# Patient Record
Sex: Male | Born: 1970 | Race: Black or African American | Hispanic: No | Marital: Single | Smoking: Former smoker
Health system: Southern US, Community
[De-identification: ages and names within clinical notes are randomized; demographics above are authoritative.]

## PROBLEM LIST (undated history)

## (undated) DIAGNOSIS — I10 Essential (primary) hypertension: Secondary | ICD-10-CM

## (undated) DIAGNOSIS — F319 Bipolar disorder, unspecified: Secondary | ICD-10-CM

## (undated) HISTORY — DX: Bipolar disorder, unspecified: F31.9

## (undated) HISTORY — DX: Essential (primary) hypertension: I10

---

## 2006-09-19 ENCOUNTER — Emergency Department (HOSPITAL_COMMUNITY): Admission: EM | Admit: 2006-09-19 | Discharge: 2006-09-19 | Payer: Self-pay | Admitting: Emergency Medicine

## 2006-12-21 ENCOUNTER — Ambulatory Visit: Payer: Self-pay | Admitting: Family Medicine

## 2007-05-18 ENCOUNTER — Ambulatory Visit: Payer: Self-pay | Admitting: Family Medicine

## 2007-08-23 ENCOUNTER — Ambulatory Visit: Payer: Self-pay | Admitting: Family Medicine

## 2007-10-26 ENCOUNTER — Ambulatory Visit: Payer: Self-pay | Admitting: Family Medicine

## 2008-06-14 ENCOUNTER — Ambulatory Visit: Payer: Self-pay | Admitting: Family Medicine

## 2008-12-27 ENCOUNTER — Encounter (INDEPENDENT_AMBULATORY_CARE_PROVIDER_SITE_OTHER): Payer: Self-pay | Admitting: *Deleted

## 2009-01-03 ENCOUNTER — Ambulatory Visit: Payer: Self-pay | Admitting: Family Medicine

## 2009-01-03 DIAGNOSIS — F319 Bipolar disorder, unspecified: Secondary | ICD-10-CM | POA: Insufficient documentation

## 2009-01-03 DIAGNOSIS — R21 Rash and other nonspecific skin eruption: Secondary | ICD-10-CM | POA: Insufficient documentation

## 2009-01-03 DIAGNOSIS — I10 Essential (primary) hypertension: Secondary | ICD-10-CM | POA: Insufficient documentation

## 2009-01-08 ENCOUNTER — Telehealth: Payer: Self-pay | Admitting: Family Medicine

## 2009-01-08 LAB — CONVERTED CEMR LAB
Albumin: 4.1 g/dL (ref 3.5–5.2)
Alkaline Phosphatase: 53 units/L (ref 39–117)
Basophils Absolute: 0.1 10*3/uL (ref 0.0–0.1)
Bilirubin, Direct: 0 mg/dL (ref 0.0–0.3)
CO2: 31 meq/L (ref 19–32)
Calcium: 9.5 mg/dL (ref 8.4–10.5)
Chlamydia, Swab/Urine, PCR: NEGATIVE
Cholesterol: 143 mg/dL (ref 0–200)
Creatinine, Ser: 1.2 mg/dL (ref 0.4–1.5)
Eosinophils Absolute: 0.2 10*3/uL (ref 0.0–0.7)
GC Probe Amp, Urine: NEGATIVE
Glucose, Bld: 93 mg/dL (ref 70–99)
HDL: 49 mg/dL (ref >39.00)
Lymphocytes Relative: 16.9 % (ref 12.0–46.0)
MCHC: 34.2 g/dL (ref 30.0–36.0)
Monocytes Absolute: 0.5 10*3/uL (ref 0.1–1.0)
Neutro Abs: 5.8 10*3/uL (ref 1.4–7.7)
Neutrophils Relative %: 74 % (ref 43.0–77.0)
RDW: 12.4 % (ref 11.5–14.6)
Triglycerides: 172 mg/dL — ABNORMAL HIGH (ref 0.0–149.0)

## 2009-01-22 ENCOUNTER — Ambulatory Visit: Payer: Self-pay | Admitting: Family Medicine

## 2009-01-23 ENCOUNTER — Encounter: Payer: Self-pay | Admitting: Family Medicine

## 2009-01-23 LAB — CONVERTED CEMR LAB
Albumin: 4 g/dL (ref 3.5–5.2)
Bilirubin, Direct: 0 mg/dL (ref 0.0–0.3)
GGT: 86 units/L — ABNORMAL HIGH (ref 7–51)
Total Protein: 6.9 g/dL (ref 6.0–8.3)

## 2009-02-04 ENCOUNTER — Ambulatory Visit: Payer: Self-pay | Admitting: Family Medicine

## 2009-02-04 DIAGNOSIS — F17200 Nicotine dependence, unspecified, uncomplicated: Secondary | ICD-10-CM | POA: Insufficient documentation

## 2009-02-18 ENCOUNTER — Ambulatory Visit: Payer: Self-pay | Admitting: Family Medicine

## 2009-04-03 ENCOUNTER — Telehealth: Payer: Self-pay | Admitting: Family Medicine

## 2009-05-08 ENCOUNTER — Telehealth: Payer: Self-pay | Admitting: Family Medicine

## 2009-07-16 ENCOUNTER — Ambulatory Visit: Payer: Self-pay | Admitting: Family Medicine

## 2009-11-08 ENCOUNTER — Ambulatory Visit: Payer: Self-pay | Admitting: Family Medicine

## 2009-11-08 DIAGNOSIS — B351 Tinea unguium: Secondary | ICD-10-CM | POA: Insufficient documentation

## 2009-11-14 ENCOUNTER — Telehealth (INDEPENDENT_AMBULATORY_CARE_PROVIDER_SITE_OTHER): Payer: Self-pay | Admitting: *Deleted

## 2009-11-20 ENCOUNTER — Ambulatory Visit: Payer: Self-pay | Admitting: Family Medicine

## 2009-11-20 DIAGNOSIS — Z8639 Personal history of other endocrine, nutritional and metabolic disease: Secondary | ICD-10-CM

## 2009-11-20 DIAGNOSIS — Z862 Personal history of diseases of the blood and blood-forming organs and certain disorders involving the immune mechanism: Secondary | ICD-10-CM | POA: Insufficient documentation

## 2009-11-26 LAB — CONVERTED CEMR LAB
AST: 37 units/L (ref 0–37)
Alkaline Phosphatase: 53 units/L (ref 39–117)
Bilirubin, Direct: 0.2 mg/dL (ref 0.0–0.3)
Calcium: 9.5 mg/dL (ref 8.4–10.5)
GFR calc non Af Amer: 89.87 mL/min (ref >60)
Glucose, Bld: 96 mg/dL (ref 70–99)
Potassium: 4.1 meq/L (ref 3.5–5.1)
Sodium: 142 meq/L (ref 135–145)
Total Bilirubin: 0.4 mg/dL (ref 0.3–1.2)

## 2009-12-05 ENCOUNTER — Telehealth: Payer: Self-pay | Admitting: Family Medicine

## 2009-12-11 ENCOUNTER — Telehealth (INDEPENDENT_AMBULATORY_CARE_PROVIDER_SITE_OTHER): Payer: Self-pay | Admitting: *Deleted

## 2009-12-23 ENCOUNTER — Telehealth (INDEPENDENT_AMBULATORY_CARE_PROVIDER_SITE_OTHER): Payer: Self-pay | Admitting: *Deleted

## 2010-01-16 ENCOUNTER — Ambulatory Visit: Payer: Self-pay | Admitting: Family Medicine

## 2010-01-16 ENCOUNTER — Encounter: Payer: Self-pay | Admitting: Family Medicine

## 2010-01-16 DIAGNOSIS — L259 Unspecified contact dermatitis, unspecified cause: Secondary | ICD-10-CM | POA: Insufficient documentation

## 2010-01-16 DIAGNOSIS — Z87891 Personal history of nicotine dependence: Secondary | ICD-10-CM | POA: Insufficient documentation

## 2010-01-17 LAB — CONVERTED CEMR LAB
Basophils Absolute: 0 10*3/uL (ref 0.0–0.1)
HCT: 40.1 % (ref 39.0–52.0)
Hemoglobin: 13.9 g/dL (ref 13.0–17.0)
Lymphs Abs: 1.7 10*3/uL (ref 0.7–4.0)
MCHC: 34.7 g/dL (ref 30.0–36.0)
MCV: 93.7 fL (ref 78.0–100.0)
Monocytes Absolute: 0.8 10*3/uL (ref 0.1–1.0)
Neutro Abs: 6.2 10*3/uL (ref 1.4–7.7)
Platelets: 210 10*3/uL (ref 150.0–400.0)
RDW: 13.8 % (ref 11.5–14.6)
TSH: 1.53 microintl units/mL (ref 0.35–5.50)

## 2010-02-04 ENCOUNTER — Telehealth (INDEPENDENT_AMBULATORY_CARE_PROVIDER_SITE_OTHER): Payer: Self-pay | Admitting: *Deleted

## 2010-02-10 ENCOUNTER — Ambulatory Visit: Payer: Self-pay | Admitting: Family Medicine

## 2010-02-10 DIAGNOSIS — L738 Other specified follicular disorders: Secondary | ICD-10-CM | POA: Insufficient documentation

## 2010-02-24 ENCOUNTER — Telehealth: Payer: Self-pay | Admitting: Family Medicine

## 2010-05-14 NOTE — Assessment & Plan Note (Signed)
Summary: discuss med to stop smoking/cbs   Vital Signs:  Patient profile:   40 year old male Height:      73 inches Weight:      200 pounds BMI:     26.48 Temp:     98.4 degrees F oral Pulse rate:   86 / minute BP sitting:   150 / 102  (left arm)  Vitals Entered By: Jeremy Johann CMA (November 08, 2009 2:55 PM) CC: discuss med to stop smoking , Depressive symptoms Comments pt not currently taking any med but will restart all med since he now has insurance....Marland KitchenMarland KitchenFelecia Deloach CMA  November 08, 2009 3:01 PM    History of Present Illness:  Hypertension follow-up      This is a 40 year old man who presents for Hypertension follow-up.  The patient denies lightheadedness, urinary frequency, headaches, edema, impotence, rash, and fatigue.  The patient denies the following associated symptoms: chest pain, chest pressure, exercise intolerance, dyspnea, palpitations, syncope, leg edema, and pedal edema.  Compliance with medications (by patient report) has been poor.  The patient reports that dietary compliance has been fair.  The patient reports no exercise.  Adjunctive measures currently used by the patient include salt restriction.    Pt is also interested in smoking---the nicotrol inhaler worked for him in the past.  He also c/o think yellow toenails.   Depressive symptoms      The patient also presents with Depressive symptoms.  The patient reports depressed mood, loss of interest/pleasure, and hypersomnia, but denies significant weight loss, significant weight gain, insomnia, psychomotor agitation, and psychomotor retardation.  The patient also reports fatigue or loss of energy and feelings of worthlessness.  The patient denies diminished concentration, indecisiveness, thoughts of death, thoughts of suicide, suicidal intent, and suicidal plans.  The patient reports the following psychosocial stressors: major life changes.  Patient's past history includes depression.  The patient denies abnormally  elevated mood, abnormally irritable mood, decreased need for sleep, increased talkativeness, distractibility, flight of ideas, increased goal-directed activity, and inflated self-esteem/ grandiosity.    Preventive Screening-Counseling & Management  Alcohol-Tobacco     Smoking Status: current     Smoking Cessation Counseling: yes     Smoke Cessation Stage: ready     Packs/Day: 1.5     Year Started: 1990  Caffeine-Diet-Exercise     Does Patient Exercise: no  Current Medications (verified): 1)  Calan Sr 240 Mg Cr-Tabs (Verapamil Hcl) .Marland Kitchen.. 1 By Mouth Once Daily. 2)  Aspirin 81 Mg Tbec (Aspirin) .Marland Kitchen.. 1 By Mouth Once Daily. 3)  Losartan Potassium 50 Mg Tabs (Losartan Potassium) .Marland Kitchen.. 1 By Mouth Once Daily 4)  Prozac 20 Mg Caps (Fluoxetine Hcl) .Marland Kitchen.. 1 By Mouth Daily. 5)  Zyprexa 5 Mg Tabs (Olanzapine) .Marland Kitchen.. 1 By Mouth Daily. 6)  Nicotrol 10 Mg Inha (Nicotine) .... As Directed --Up To 16 Cartridges A Day  Allergies (verified): No Known Drug Allergies  Past History:  Past medical, surgical, family and social histories (including risk factors) reviewed for relevance to current acute and chronic problems.  Past Medical History: Reviewed history from 01/03/2009 and no changes required. Hypertension Current Problems:  BIPOLAR DISORDER UNSPECIFIED (ICD-296.80) SKIN RASH (ICD-782.1) PREVENTIVE HEALTH CARE (ICD-V70.0) FAMILY HISTORY DIABETES 1ST DEGREE RELATIVE (ICD-V18.0) HYPERTENSION (ICD-401.9)  Family History: Reviewed history from 01/03/2009 and no changes required. Family History Diabetes 1st degree relative Family History Hypertension Family History of Prostate CA 1st degree relative <50  Social History: Reviewed history from 02/18/2009 and  no changes required. unemployed Alcohol use-yes Regular exercise-no Former Smoker Smoking Status:  current Packs/Day:  1.5  Review of Systems      See HPI  Physical Exam  General:  Well-developed,well-nourished,in no acute distress;  alert,appropriate and cooperative throughout examination Neck:  No deformities, masses, or tenderness noted. Lungs:  Normal respiratory effort, chest expands symmetrically. Lungs are clear to auscultation, no crackles or wheezes. Heart:  normal rate and no murmur.   Extremities:  No clubbing, cyanosis, edema, or deformity noted with normal full range of motion of all joints.  + big toenails --thick, yellow, cracked Skin:  Intact without suspicious lesions or rashes Cervical Nodes:  No lymphadenopathy noted Psych:  Oriented X3, normally interactive, good eye contact, not suicidal, and subdued.     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   Problem # 3:  TOBACCO ABUSE (ICD-305.1)  Encouraged smoking cessation and discussed different methods for smoking cessation.   His updated medication list for this problem includes:    Nicotrol 10 Mg Inha (Nicotine) .Marland Kitchen... As directed --up to 16 cartridges a day  Problem # 4:  HYPERTENSION (ICD-401.9)  His updated medication list for this problem includes:    Calan Sr 240 Mg Cr-tabs (Verapamil hcl) .Marland Kitchen... 1 by mouth once daily.    Losartan Potassium 50 Mg Tabs (Losartan potassium) .Marland Kitchen... 1 by mouth once daily  BP today: 150/102 Prior BP: 136/86 (02/18/2009)  Labs Reviewed: K+: 3.7 (01/03/2009) Creat: : 1.2 (01/03/2009)   Chol: 143 (01/03/2009)   HDL: 49.00 (01/03/2009)   LDL: 60 (01/03/2009)   TG: 172.0 (01/03/2009)  Complete Medication List: 1)  Calan Sr 240 Mg Cr-tabs (Verapamil hcl) .Marland Kitchen.. 1 by mouth once daily. 2)  Aspirin 81 Mg Tbec (Aspirin) .Marland Kitchen.. 1 by mouth once daily. 3)  Losartan Potassium 50 Mg Tabs (Losartan potassium) .Marland Kitchen.. 1 by mouth once daily 4)  Prozac 20 Mg Caps (Fluoxetine hcl) .Marland Kitchen.. 1 by mouth  daily. 5)  Zyprexa 5 Mg Tabs (Olanzapine) .Marland Kitchen.. 1 by mouth daily. 6)  Nicotrol 10 Mg Inha (Nicotine) .... As directed --up to 16 cartridges a day  Patient Instructions: 1)  Please schedule a follow-up appointment in 2 weeks.  Prescriptions: NICOTROL 10 MG INHA (NICOTINE) as directed --up to 16 cartridges a day  #1 month x 2   Entered and Authorized by:   Loreen Freud DO   Signed by:   Loreen Freud DO on 11/08/2009   Method used:   Electronically to        [facility] #[id number]* (retail)       [address] [place], [place]  [address]       Ph: [phone]       Fax: [phone]   RxID:   [id number] ZYPREXA 5 MG TABS (OLANZAPINE) 1 by mouth daily.  #30 x 0   Entered and Authorized by:   Loreen Freud DO   Signed by:   Loreen Freud DO on 11/08/2009   Method used:   Electronically to        [facility] #[id number]* (retail)       [address] [place], [place]  [address]       Ph: [phone]  Fax: [phone]   RxID:   [id number] PROZAC 20 MG CAPS (FLUOXETINE HCL) 1 by mouth daily.  #30 x 0   Entered and Authorized by:   Loreen Freud DO   Signed by:   Loreen Freud DO on 11/08/2009   Method used:   Electronically to        [facility] #[id number]* (retail)       [address] [place], [place]  [address]       Ph: [phone]       Fax: [phone]   RxID:   [id number] LOSARTAN POTASSIUM 50 MG TABS (LOSARTAN POTASSIUM) 1 by mouth once daily  #30 x 0   Entered and Authorized by:   Loreen Freud DO   Signed by:   Loreen Freud DO on 11/08/2009   Method used:   Electronically to        [facility] #[id number]* (retail)       [address] [place], [place]  [address]       Ph: [phone]       Fax: [phone]   RxID:   [id number] CALAN SR 240 MG CR-TABS (VERAPAMIL HCL) 1 by mouth once daily.  #30 x 0   Entered and Authorized by:   Loreen Freud DO   Signed by:   Loreen Freud DO on 11/08/2009   Method  used:   Electronically to        [facility] #[id number]* (retail)       [address] [place], [place]  [address]       Ph: [phone]       Fax: [phone]   RxID:   [id number]

## 2010-05-14 NOTE — Assessment & Plan Note (Signed)
Summary: CPX/FASTING//KN   Vital Signs:  Patient profile:   40 year old male Height:      74 inches Weight:      202.2 pounds Temp:     97.2 degrees F oral Pulse rate:   74 / minute Pulse rhythm:   regular BP sitting:   172 / 110  (left arm) Cuff size:   large  Vitals Entered By: Almeta Monas CMA Duncan Dull) (January 16, 2010 8:31 AM) CC: CPX/FASTING   Serial Vital Signs/Assessments:  Time      Position  BP       Pulse  Resp  Temp     By 8:32 AM             150/100                        Almeta Monas CMA (AAMA)   History of Present Illness: Pt here for cpe and labs --- Pt thinks he has carpal tunnel R wrist --pt types on computer all day.   Pt also c/o dry skin on L arm ---used cortisone cream with little relief.     Hypertension follow-up      This is a 40 year old man who presents for Hypertension follow-up.  The patient denies lightheadedness, urinary frequency, headaches, edema, impotence, rash, and fatigue.  The patient denies the following associated symptoms: chest pain, chest pressure, exercise intolerance, dyspnea, palpitations, syncope, leg edema, and pedal edema.  Compliance with medications (by patient report) has been poor.  The patient reports that dietary compliance has been good.  The patient reports exercising occasionally.  Adjunctive measures currently used by the patient include salt restriction.    Preventive Screening-Counseling & Management  Alcohol-Tobacco     Alcohol drinks/day: >5     Alcohol type: beer     >5/day in last 3 mos: yes     Alcohol Counseling: to STOP drinking     Feels need to cut down: yes     Feels guilty re: drinking: yes     Needs 'eye opener' in am: yes     Smoking Status: quit     Smoking Cessation Counseling: yes     Smoke Cessation Stage: quit     Packs/Day: 1.5     Year Started: 1990     Year Quit: 2011  Caffeine-Diet-Exercise     Caffeine use/day: 0     Does Patient Exercise: yes     Type of exercise: walk  Times/week: <3     Exercise Counseling: to improve exercise regimen  Hep-HIV-STD-Contraception     HIV Risk: risk noted     HIV Risk Counseling: to avoid increased HIV risk     STD Risk: risk noted     STD Risk Counseling: to avoid increased STD risk     Dental Visit-last 6 months yes     Dental Care Counseling: to seek dental care; no dental care within six months  Safety-Violence-Falls     Seat Belt Use: yes      Sexual History:  separated.    Current Medications (verified): 1)  Calan Sr 240 Mg Cr-Tabs (Verapamil Hcl) .Marland Kitchen.. 1 By Mouth Once Daily. 2)  Aspirin 81 Mg Tbec (Aspirin) .Marland Kitchen.. 1 By Mouth Once Daily. 3)  Losartan Potassium 100 Mg Tabs (Losartan Potassium) .Marland Kitchen.. 1 By Mouth Once Daily 4)  Prozac 20 Mg Caps (Fluoxetine Hcl) .Marland Kitchen.. 1 By Mouth Daily. 5)  Zyprexa 5 Mg  Tabs (Olanzapine) .Marland Kitchen.. 1 By Mouth Daily. 6)  Nicotrol 10 Mg Inha (Nicotine) .... As Directed-Up To 16 Cartridges A Day  Allergies (verified): No Known Drug Allergies  Past History:  Past Medical History: Last updated: 01/03/2009 Hypertension Current Problems:  BIPOLAR DISORDER UNSPECIFIED (ICD-296.80) SKIN RASH (ICD-782.1) PREVENTIVE HEALTH CARE (ICD-V70.0) FAMILY HISTORY DIABETES 1ST DEGREE RELATIVE (ICD-V18.0) HYPERTENSION (ICD-401.9)  Family History: Last updated: 01/03/2009 Family History Diabetes 1st degree relative Family History Hypertension Family History of Prostate CA 1st degree relative <50  Social History: Last updated: 01/16/2010 Alcohol use-yes Regular exercise-no Former Smoker 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 Family History Diabetes 1st degree relative Family History Hypertension Family History of Prostate CA 1st degree relative <50  Social History: Reviewed history from 11/20/2009 and no changes required. 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 Eyes:  Denies blurring, discharge, double vision, eye irritation, eye pain, halos, itching, light sensitivity, red eye, vision loss-1 eye, and vision loss-both eyes; optho-q1y. ENT:  Denies decreased hearing, difficulty swallowing, ear discharge, earache, hoarseness, nasal congestion, nosebleeds, postnasal drainage, ringing in ears, sinus pressure, and sore throat. CV:  Denies bluish discoloration of lips or nails, chest pain or discomfort, difficulty breathing at night, difficulty breathing while lying down, fainting, fatigue, leg cramps with exertion, lightheadness, near fainting, palpitations, shortness of breath with exertion, swelling of feet, swelling of hands, and weight gain. Resp:  Denies chest discomfort, chest pain with inspiration, cough, coughing up blood, excessive snoring, hypersomnolence, morning headaches, pleuritic, shortness of breath, sputum productive, and wheezing. GI:  Denies abdominal pain, bloody stools, change in bowel habits, constipation, dark tarry stools, diarrhea, excessive appetite, gas, hemorrhoids, indigestion, loss of appetite, nausea, vomiting, vomiting blood, and yellowish skin color. GU:  Denies decreased libido, discharge, dysuria, erectile dysfunction, genital sores, hematuria, incontinence, nocturia, urinary frequency, and urinary hesitancy. MS:   Denies joint pain, joint redness, joint swelling, loss of strength, low back pain, mid back pain, muscle aches, muscle , cramps, muscle weakness, stiffness, and thoracic pain. Derm:  Denies changes in color of skin, changes in nail beds, dryness, excessive perspiration, flushing, hair loss, insect bite(s), itching, lesion(s), poor wound healing, and rash. Neuro:  Denies brief paralysis, difficulty with concentration, disturbances in coordination, falling down, headaches, inability to speak, memory loss, numbness, poor balance, seizures, sensation of room spinning, tingling, tremors, visual disturbances, and weakness. Psych:  Denies alternate hallucination ( auditory/visual), anxiety, depression, easily angered, easily tearful, irritability, mental problems, panic attacks, sense of great danger, suicidal thoughts/plans, thoughts of violence, unusual visions or sounds, and thoughts /plans of harming others. Endo:  Denies cold intolerance, excessive hunger, excessive thirst, excessive urination, heat intolerance, polyuria, and weight change. Heme:  Denies abnormal bruising, bleeding, enlarge lymph nodes, fevers, pallor, and skin discoloration. Allergy:  Denies hives or rash, itching eyes, persistent infections, seasonal allergies, and sneezing.  Physical Exam  General:  Well-developed,well-nourished,in no acute distress; alert,appropriate and cooperative throughout examination Head:  Normocephalic and atraumatic without obvious abnormalities. No apparent alopecia or balding. Eyes:  vision grossly intact, pupils equal, pupils round, pupils reactive to light, and no injection.   Ears:  External ear exam shows no significant lesions or deformities.  Otoscopic examination reveals clear canals, tympanic membranes are intact bilaterally without bulging, retraction, inflammation or discharge. Hearing is grossly normal bilaterally. Nose:  External nasal examination shows no deformity or inflammation. Nasal mucosa  are pink and moist without lesions or exudates. Mouth:  Oral mucosa and oropharynx without lesions or exudates.  Teeth in good repair. Neck:  No deformities, masses, or tenderness noted.no carotid bruits.   Chest Wall:  No deformities, masses, tenderness or gynecomastia noted. Lungs:  Normal respiratory effort, chest expands symmetrically. Lungs are clear to auscultation, no crackles or wheezes. Heart:  normal rate and no murmur.   Abdomen:  Bowel sounds positive,abdomen soft and non-tender without masses, organomegaly or hernias noted. Rectal:  No external abnormalities noted. Normal sphincter tone. No rectal masses or tenderness. heme neg brown stool Genitalia:  Testes bilaterally descended without nodularity, tenderness or masses. No scrotal masses or lesions. No penis lesions or urethral discharge. Prostate:  Prostate gland firm and smooth, no enlargement, nodularity, tenderness, mass, asymmetry or induration. Msk:  normal ROM, no joint tenderness, no joint swelling, no joint warmth, no redness over joints, no joint deformities, no joint instability, and no crepitation.   Pulses:  R and L carotid,radial,femoral,dorsalis pedis and posterior tibial pulses are full and equal bilaterally Extremities:  No clubbing, cyanosis, edema, or deformity noted with normal full range of motion of all joints.   Neurologic:  No cranial nerve deficits noted. Station and gait are normal. Plantar reflexes are down-going bilaterally. DTRs are symmetrical throughout. Sensory, motor and coordinative functions appear intact. Skin:  dry patches on arms and legs no errythema Cervical Nodes:  No lymphadenopathy noted Psych:  Cognition and judgment appear intact. Alert and cooperative with normal attention span and concentration. No apparent delusions, illusions, hallucinations   Impression & Recommendations:  Problem # 1:  PREVENTIVE HEALTH CARE (ICD-V70.0)  Orders: Venipuncture (11914) TLB-CBC Platelet -  w/Differential (85025-CBCD) TLB-TSH (Thyroid Stimulating Hormone) (84443-TSH) TLB-PSA (Prostate Specific Antigen) (84153-PSA) T- * Misc. Laboratory test [phone]) Specimen Handling (62130) EKG w/ Interpretation (93000)  Reviewed preventive care protocols, scheduled due services, and updated immunizations.  Problem # 2:  TOBACCO USE, QUIT (ICD-V15.82)  still using nicotrol inhaler Encouraged smoking cessation and discussed different methods for smoking cessation.   Orders: EKG w/ Interpretation (93000)  Problem # 3:  BIPOLAR DISORDER UNSPECIFIED (ICD-296.80)  per psych  Orders: EKG w/ Interpretation (93000) Psychiatric Referral (Psych)  Problem # 4:  HYPERTENSION (ICD-401.9)  Orders: Venipuncture (86578) TLB-CBC Platelet - w/Differential (85025-CBCD) TLB-TSH (Thyroid Stimulating Hormone) (84443-TSH) TLB-PSA (Prostate Specific Antigen) (84153-PSA) T- * Misc. Laboratory test [phone]) Specimen Handling (95284) EKG w/ Interpretation (93000)  His updated medication list for this problem includes:    Calan Sr 240 Mg Cr-tabs (Verapamil hcl) .Marland Kitchen... 1 by mouth once daily.    Losartan Potassium 100 Mg Tabs (Losartan potassium) .Marland Kitchen... 1 by mouth once daily  BP today: 172/110 Prior BP: 130/96 (11/20/2009)  Labs Reviewed: K+: 4.1 (11/20/2009) Creat: : 1.2 (11/20/2009)   Chol: 143 (01/03/2009)   HDL: 49.00 (01/03/2009)   LDL: 60 (01/03/2009)   TG: 172.0 (01/03/2009)  Problem # 5:  ECZEMA (ICD-692.9)  use otc cortisone  mixed with eucerin cream  Discussed avoidance of triggers and symptomatic treatment.   Complete Medication List: 1)  Calan Sr 240 Mg Cr-tabs (Verapamil hcl) .Marland Kitchen.. 1 by mouth once daily. 2)  Aspirin 81 Mg Tbec (Aspirin) .Marland Kitchen.. 1 by mouth once daily. 3)  Losartan Potassium 100 Mg Tabs (Losartan potassium) .Marland Kitchen.. 1 by mouth once daily 4)  Prozac 20 Mg Caps (Fluoxetine hcl) .Marland Kitchen.. 1 by mouth daily. 5)  Zyprexa 5 Mg Tabs (Olanzapine) .Marland Kitchen.. 1 by mouth daily. 6)  Nicotrol 10 Mg  Inha (Nicotine) .... As directed-up to 16 cartridges a day  Other Orders: Tdap => 65yrs IM ([address]109) Admin 1st Vaccine ([address]) Admin 1st Vaccine (09811) Flu Vaccine 67yrs + (91478)  Patient Instructions: 1)  Please schedule a follow-up appointment in 2 weeks.  Prescriptions: NICOTROL 10 MG INHA (NICOTINE) as directed-up to 16 cartridges a day  #1 x 1   Entered and Authorized by:   Loreen Freud DO   Signed by:   Loreen Freud DO on 01/16/2010   Method used:   Electronically to        [facility] #[id number]* (retail)       [address] [place], [place]  [address]       Ph: [phone]       Fax: [phone]   RxID:   [id number] PROZAC 20 MG CAPS (FLUOXETINE HCL) 1 by mouth daily.  #90 x 3   Entered and Authorized by:   Loreen Freud DO   Signed by:   Loreen Freud DO on 01/16/2010   Method used:   Electronically to        [facility] #[id number]* (retail)       [address] [place], [place]  [address]       Ph: [phone]       Fax: [phone]   RxID:   [id number] HCWCBJSE POTASSIUM 100 MG TABS (LOSARTAN POTASSIUM) 1 by mouth once daily  #90 x 3   Entered and Authorized by:   Loreen Freud DO   Signed by:   Loreen Freud DO on 01/16/2010   Method used:   Electronically to        [facility] #[id number]* (retail)       [address] [place], [place]  [address]       Ph: [phone]       Fax: [phone]   RxID:   [id number] CALAN SR 240 MG CR-TABS (VERAPAMIL HCL) 1 by mouth once daily.  #90 x 3   Entered and Authorized by:   Loreen Freud DO   Signed by:   Loreen Freud DO on 01/16/2010   Method used:   Electronically to        [facility] #[id number]* (retail)       [address] [place], [place]  [address]       Ph: [phone]       Fax: [phone]   RxID:   [id number]    Immunizations Administered:  Tetanus Vaccine:    Vaccine Type: Tdap    Site: right deltoid    Mfr: Merck    Dose: 0.5 ml     Route: IM    Given by: Almeta Monas CMA (AAMA)    Exp. Date: 01/31/2012    Lot #: [id number]    VIS given: 02/29/08 version given  January 16, 2010.    Immunizations Administered:  Tetanus Vaccine:    Vaccine Type: Tdap    Site: right deltoid    Mfr: Merck    Dose: 0.5 ml    Route: IM    Given by: Almeta Monas CMA (AAMA)    Exp. Date: 01/31/2012    Lot #: [id number]    VIS given: 02/29/08 version given January 16, 2010. Flu Vaccine Consent Questions     Do you have a history of severe allergic reactions to this vaccine? no    Any prior history of allergic reactions to egg and/or gelatin? no    Do you have a sensitivity to the preservative Thimersol? no    Do you have a past history of Guillan-Barre Syndrome? no    Do you currently have an acute febrile illness? no    Have you ever had a severe reaction to latex? no    Vaccine information given and explained to patient? yes    Are you currently pregnant? no    Lot Number:[id number]   Exp Date:10/11/2010   Site Given  Left Deltoid IM.lbflu

## 2010-05-14 NOTE — Assessment & Plan Note (Signed)
Summary: rto 2 weeks/cbs   Vital Signs:  Patient profile:   40 year old male Height:      73 inches Weight:      204 pounds Temp:     98.4 degrees F oral Pulse rate:   72 / minute BP sitting:   130 / 96  (left arm)  Vitals Entered By: Jeremy Johann CMA (November 20, 2009 8:17 AM) CC: 2 week f/u bp check   History of Present Illness:  Hypertension follow-up      This is a 40 year old man who presents for Hypertension follow-up.  The patient denies lightheadedness, urinary frequency, headaches, edema, impotence, rash, and fatigue.  The patient denies the following associated symptoms: chest pain, chest pressure, exercise intolerance, dyspnea, palpitations, syncope, leg edema, and pedal edema.  Compliance with medications (by patient report) has been near 100%.  The patient reports that dietary compliance has been good.  The patient reports no exercise.  Adjunctive measures currently used by the patient include salt restriction.    Current Medications (verified): 1)  Calan Sr 240 Mg Cr-Tabs (Verapamil Hcl) .Marland Kitchen.. 1 By Mouth Once Daily. 2)  Aspirin 81 Mg Tbec (Aspirin) .Marland Kitchen.. 1 By Mouth Once Daily. 3)  Losartan Potassium 100 Mg Tabs (Losartan Potassium) .Marland Kitchen.. 1 By Mouth Once Daily 4)  Prozac 20 Mg Caps (Fluoxetine Hcl) .Marland Kitchen.. 1 By Mouth Daily. 5)  Zyprexa 5 Mg Tabs (Olanzapine) .Marland Kitchen.. 1 By Mouth Daily. 6)  Nicotrol 10 Mg Inha (Nicotine) .... As Directed --Up To 16 Cartridges A Day  Allergies (verified): No Known Drug Allergies  Past History:  Past medical, surgical, family and social histories (including risk factors) reviewed for relevance to current acute and chronic problems.  Past Medical History: Reviewed history from 01/03/2009 and no changes required. Hypertension Current Problems:  BIPOLAR DISORDER UNSPECIFIED (ICD-296.80) SKIN RASH (ICD-782.1) PREVENTIVE HEALTH CARE (ICD-V70.0) FAMILY HISTORY DIABETES 1ST DEGREE RELATIVE (ICD-V18.0) HYPERTENSION (ICD-401.9)  Family  History: Reviewed history from 01/03/2009 and no changes required. Family History Diabetes 1st degree relative Family History Hypertension Family History of Prostate CA 1st degree relative <50  Social History: Reviewed history from 02/18/2009 and no changes required. 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 Lungs:  Normal respiratory effort, chest expands symmetrically. Lungs are clear to auscultation, no crackles or wheezes. Heart:  normal rate and no murmur.   Extremities:  No clubbing, cyanosis, edema, or deformity noted with normal full range of motion of all joints.   Skin:  Intact without suspicious lesions or rashes Psych:  Cognition and judgment appear intact. Alert and cooperative with normal attention span and concentration. No apparent delusions, illusions, hallucinations   Impression & Recommendations:  Problem # 1:  HYPERTENSION (ICD-401.9)  His updated medication list for this problem includes:    Calan Sr 240 Mg Cr-tabs (Verapamil hcl) .Marland Kitchen... 1 by mouth once daily.    Losartan Potassium 100 Mg Tabs (Losartan potassium) .Marland Kitchen... 1 by mouth once daily  Orders: Venipuncture (56213) TLB-BMP (Basic Metabolic Panel-BMET) (80048-METABOL) TLB-Hepatic/Liver Function Pnl (80076-HEPATIC)  BP today: 130/96 Prior BP: 150/102 (11/08/2009)  Labs Reviewed: K+: 3.7 (01/03/2009) Creat: : 1.2 (01/03/2009)   Chol: 143 (01/03/2009)   HDL: 49.00 (01/03/2009)   LDL: 60 (01/03/2009)   TG: 172.0 (01/03/2009)  Problem # 2:  LIVER FUNCTION TESTS, ABNORMAL, HX OF (ICD-V12.2)  Orders: TLB-Hepatic/Liver Function Pnl (80076-HEPATIC)  Problem # 3:  BIPOLAR DISORDER UNSPECIFIED (ICD-296.80) con't meds pt is doing  better  Complete Medication List: 1)  Calan Sr 240 Mg Cr-tabs (Verapamil hcl) .Marland Kitchen.. 1 by mouth once daily. 2)  Aspirin 81 Mg Tbec (Aspirin) .Marland Kitchen.. 1 by mouth once daily. 3)  Losartan Potassium 100 Mg Tabs (Losartan potassium) .Marland Kitchen.. 1 by mouth once daily 4)  Prozac 20 Mg Caps (Fluoxetine hcl) .Marland Kitchen.. 1 by mouth daily. 5)  Zyprexa 5 Mg Tabs (Olanzapine) .Marland Kitchen.. 1 by mouth daily. 6)  Nicotrol 10 Mg Inha (Nicotine) .... As directed --up to 16 cartridges a day  Patient Instructions: 1)  schedule physical  Prescriptions: LOSARTAN POTASSIUM 100 MG TABS (LOSARTAN POTASSIUM) 1 by mouth once daily  #30 x 2   Entered and Authorized by:   Loreen Freud DO   Signed by:   Loreen Freud DO on 11/20/2009   Method used:   Electronically to        [id number]* (retail)       [address] [place], [place]  [address]       Ph: [phone]       Fax: [phone]   RxID:   [id number]   Appended Document: rto 2 weeks/cbs

## 2010-05-14 NOTE — Progress Notes (Signed)
Summary: Lamisil RF 10/26  Phone Note Call from Patient   Caller: Patient Call For: Devon Freud DO Details for Reason: Lamisil  Summary of Call: pt called and request a refill request of Lamisil be called to [facility] on [address]. Removed from med list... Pt is currently having a fungus on his foot.. C/b # [id number]. Please advise. Initial call taken by: Almeta Monas CMA Duncan Dull),  February 04, 2010 4:30 PM  Follow-up for Phone Call        ok to refill for 1 month 1 refill --  check LFTs in 6 weeks after starting----should take for 3 months total--we will need to check lft end of 12 weeks as well Follow-up by: Devon Freud DO,  February 04, 2010 5:24 PM  Additional Follow-up for Phone Call Additional follow up Details #1::        Rx faxed tp pharmacy... Mssg left to advise pt of the above. Additional Follow-up by: Almeta Monas CMA Duncan Dull),  February 05, 2010 8:08 AM    Additional Follow-up for Phone Call Additional follow up Details #2::    pt aware, will f/u in 12 wks for LFT check Follow-up by: Almeta Monas CMA Duncan Dull),  February 05, 2010 9:19 AM  New/Updated Medications: LAMISIL 250 MG TABS (TERBINAFINE HCL) 1 by mouth once daily Prescriptions: LAMISIL 250 MG TABS (TERBINAFINE HCL) 1 by mouth once daily  #30 x 1   Entered by:   Almeta Monas CMA (AAMA)   Authorized by:   Devon Freud DO   Signed by:   Almeta Monas CMA (AAMA) on 02/05/2010   Method used:   Faxed to ...       [facility] [address] (retail)       [phone]       [place], [place]  [address]       Ph: [phone]       Fax: [phone]   RxID:   [id number]

## 2010-05-14 NOTE — Progress Notes (Signed)
Summary: Request for nicotrol 9/13  Phone Note Call from Patient   Details for Reason: Request for Nicotrol Summary of Call: Mssg from pt requesting Nicotrol.  Last OV 11/20/09  No rx on file .c/b [id number] please advise. Initial call taken by: Almeta Monas CMA Duncan Dull),  December 23, 2009 3:29 PM  Follow-up for Phone Call        for what?  patch,  inhaler?  patches and lozenges are otc.   Follow-up by: Loreen Freud DO,  December 23, 2009 4:58 PM  Additional Follow-up for Phone Call Additional follow up Details #1::        Left message to call back  Almeta Monas CMA Duncan Dull)  December 24, 2009 8:26 AM     Additional Follow-up for Phone Call Additional follow up Details #2::    call from pt and he wanted to get a n rx of the Nicotrol inhaler that was prescribed on 11/08/09 to help him quit smoking, wants it called to [facility] on [address]. Per pt he did not have any refills and it is helping him C/B # [id number].  Almeta Monas CMA Duncan Dull)  December 24, 2009 5:11 PM    Additional Follow-up for Phone Call Additional follow up Details #3:: Details for Additional Follow-up Action Taken: nicotrol inh   As directed --up to 16 cartridges a day Additional Follow-up by: Loreen Freud DO,  December 25, 2009 9:16 AM  New/Updated Medications: NICOTROL 10 MG INHA (NICOTINE) as directed-up to 16 cartridges a day Prescriptions: NICOTROL 10 MG INHA (NICOTINE) as directed-up to 16 cartridges a day  #1 x 0   Entered by:   Almeta Monas CMA (AAMA)   Authorized by:   Loreen Freud DO   Signed by:   Almeta Monas CMA (AAMA) on 12/26/2009   Method used:   Electronically to        [facility] [address] [id number]* (retail)       [address] [place], [place]  [address]       Ph: [phone]       Fax: [phone]   RxID:   [id number]

## 2010-05-14 NOTE — Letter (Signed)
Summary: Cancer Screening/[facility] Personalized Risk Profile  Cancer Screening/[facility] Personalized Risk Profile   Imported By: Lanelle Bal 01/23/2010 08:33:40  _____________________________________________________________________  External Attachment:    Type:   Image     Comment:   External Document

## 2010-05-14 NOTE — Progress Notes (Signed)
Summary: lmtc 8-31-Hangnail  Phone Note Call from Patient Call back at Home Phone [phone]   Caller: Patient Summary of Call: pt went and had a manicure last week. he states he has an infected hangnail. wants to know if there is something that can do without an OV. please advise [phone] Initial call taken by: Lavell Islam,  December 11, 2009 12:56 PM  Follow-up for Phone Call        left message to call office.   per dr hopper soak nail in normal saline tid and montior for fever, or red streak and ov if no relief and update tetanus .........Marland KitchenFelecia Deloach CMA  December 11, 2009 2:51 PM     Additional Follow-up for Phone Call Additional follow up Details #1::        DISCUSS WITH PATIENT, pt verbalized understanding............Marland KitchenFelecia Deloach CMA  December 11, 2009 5:01 PM

## 2010-05-14 NOTE — Progress Notes (Signed)
Summary: RX request for Lamisil  lmtc 8/25  Phone Note Call from Patient Message from:  Patient on December 05, 2009 1:57 PM  Caller: Patient Call For: Loreen Freud DO Details for Reason: Pt wants an Rx for Lamisil Summary of Call: Pt called and stated that he was advised he could have an Rx for Lamisil if he came in and Liver function test wa normal. Per pt labs were normal and he wanted to get an RX for Lamisil called to [facility] on [address]. Please Advise.                 Almeta Monas CMA Duncan Dull)  December 05, 2009 1:59 PM   Follow-up for Phone Call        sent to pharmacy---  need to check liver function in 6 weeks--- before he gets 3rd refill----should be on it total 12 weeks   TC to pt. Lmtc on vm     Almeta Monas CMA Duncan Dull)  December 05, 2009 3:14 PM  Follow-up by: Loreen Freud DO,  December 05, 2009 2:09 PM  Additional Follow-up for Phone Call Additional follow up Details #1::        Pt made aware  and agree to make a f/u appt in 6 weeks.   Almeta Monas CMA Duncan Dull)  December 05, 2009 3:56 PM     New/Updated Medications: LAMISIL 250 MG TABS (TERBINAFINE HCL) 1 by mouth once daily Prescriptions: LAMISIL 250 MG TABS (TERBINAFINE HCL) 1 by mouth once daily  #30 x 1   Entered and Authorized by:   Loreen Freud DO   Signed by:   Loreen Freud DO on 12/05/2009   Method used:   Electronically to        [facility] [address] (retail)       [phone]       [place], [place]  [address]       Ph: [phone]       Fax: [phone]   RxID:   [id number]

## 2010-05-14 NOTE — Progress Notes (Signed)
Summary: Change sleep meds//Nicotrol Refill 11/14  Phone Note Refill Request   Refills Requested: Medication #1:  NICOTROL 10 MG INHA as directed-up to 16 cartridges a day Caller: Patient Call For: Loreen Freud DO  Follow-up for Phone Call        call from pt request a refill on Nicotrol and also wanted to change sleep meds. Pt stated he was seeing a Therapist named Verita Schneiders and she recommended that he changes his sleep meds from Zyprexa to Ambien. Please advise whether this is ok or will pt need an OV to discuss. C/B # is [phone] Follow-up by: Almeta Monas CMA Duncan Dull),  February 24, 2010 2:06 PM  Additional Follow-up for Phone Call Additional follow up Details #1::        pt was on zyprexa for bipolar disorder not for sleep---in july he told me he was going to psychiatrist ---who is supposed to be able to write drugs----Is he only seeing therapist.  ? Additional Follow-up by: Loreen Freud DO,  February 24, 2010 3:17 PM    Additional Follow-up for Phone Call Additional follow up Details #2::    Left message to call back..... Almeta Monas CMA Duncan Dull)  February 24, 2010 4:58 PM  spk with patient and he stated that Verita Schneiders is a family counselor at the office in [place], he thought that was ok to see her, advised the patient to make an appt with psych Andee Poles who is in the same office so they can manage his issues. He voiced understanding and said he  will do so. He said he was advised by Mrs. Hadgkiss to call us and get something to help him sleep, He stated he has not taken the Zyprexa in a long time because he could not afford it. Wanted to know if he could get something for sleep until he gets an  appt with Dr.Mckinney... Please advise  Additional Follow-up for Phone Call Additional follow up Details #3:: Details for Additional Follow-up Action Taken: ambien 10 mg  #15  1 by mouth every other to every 3rd night as needed sleep.   Additional Follow-up by: Loreen Freud DO,  February 25, 2010 9:34 AM  New/Updated Medications: AMBIEN 10 MG TABS (ZOLPIDEM TARTRATE) 1 by mouth every other day to every 3rd night as needed for sleep Prescriptions: NICOTROL 10 MG INHA (NICOTINE) as directed-up to 16 cartridges a day  #1 x 1   Entered by:   Almeta Monas CMA (AAMA)   Authorized by:   Loreen Freud DO   Signed by:   Almeta Monas CMA (AAMA) on 02/25/2010   Method used:   Printed then faxed to ...       [id number] (retail)       [address]       [place], [place]  [address]       Ph: [phone]       Fax: [phone]   RxID:   [id number] AMBIEN 10 MG TABS (ZOLPIDEM TARTRATE) 1 by mouth every other day to every 3rd night as needed for sleep  #15 x 0   Entered by:   Almeta Monas CMA (AAMA)   Authorized by:   Loreen Freud DO   Signed by:   Almeta Monas CMA (AAMA) on 02/25/2010   Method used:   Printed then faxed to ...       [facility] [address] [id number] (  [facility]ail)       [address]       [place], [place]  [address]       Ph: [phone]       Fax: [phone]   RxID:   [id number]  Call back from pt Rx [facility] to [facility] [address]faxed to [phone].... Almeta Monas CMA Duncan Dull)  February 25, 2010 10:07 AM

## 2010-05-14 NOTE — Progress Notes (Signed)
Summary: Meds  Phone Note Call from Patient Call back at Work Phone [phone]   Summary of Call: Pt called and stated that he is about to run out the Symbax tomorrow- he never picked up the Zyprexa or the Prozac he states. He is seeing the Psych on Feb 11th. Is this okay to give him samples. Please advise? Army Fossa CMA  May 08, 2009 1:08 PM   Follow-up for Phone Call        yes Follow-up by: Loreen Freud DO,  May 08, 2009 7:52 PM  Additional Follow-up for Phone Call Additional follow up Details #1::        left message for pt that samples are upfront for him to pick up. Army Fossa CMA  May 09, 2009 8:04 AM

## 2010-05-14 NOTE — Progress Notes (Signed)
Summary: RX request-  Phone Note Call from Patient Call back at Home Phone [phone]   Details for Reason: [facility] Summary of Call: Patient called requesting prescription for Lamisil (generic). C/O fungus and nail looking dead on great toes, patient spoke with his brother and discovered that he had this same issue before and the Lamisil took care of it within 6 months. Is it ok to send prescription to pharmacy? Please advise. Initial call taken by: Lucious Groves CMA,  November 14, 2009 11:06 AM  Follow-up for Phone Call        most of the time insurance will not pay for lamisil because they consider it a cosmetic problem--he also had elevated liver functions when he was here last and it can make that worse--- we can get him to a podiatrist for further evaluation if he likes---sometimes they can get it paid for.  Ins almost always at least wants a biopsy of the nail.   Follow-up by: Loreen Freud DO,  November 14, 2009 11:22 AM  Additional Follow-up for Phone Call Additional follow up Details #1::        Left message on voicemail to call back to office. Lucious Groves CMA  November 14, 2009 2:43 PM     Additional Follow-up for Phone Call Additional follow up Details #2::    Returning patient call, left message on voicemail to call back to office. Lucious Groves CMA  November 15, 2009 12:01 PM   Patient notified and he states that the generic version is $4 at [facility]. He would like generic prescription sent to [facility]. and he will go there and pay cash. He has an appt with a podiatrist in 2 weeks.  Lucious Groves CMA  November 15, 2009 3:27 PM     Additional Follow-up for Phone Call Additional follow up Details #3:: Details for Additional Follow-up Action Taken: we need to check LFT first because they have been high.   790.4  Hep panal---if they are still high he should not take it  left message to call office...................Marland KitchenFelecia Deloach CMA  November 15, 2009 4:09 PM   left  pt detail message...................Marland KitchenFelecia Deloach CMA  November 18, 2009 12:12 PM  Additional Follow-up by: Loreen Freud DO,  November 15, 2009 3:37 PM

## 2010-05-14 NOTE — Assessment & Plan Note (Signed)
Summary: rto 2 weeks/cbs   Vital Signs:  Patient profile:   40 year old male Weight:      196.2 pounds Pulse rate:   88 / minute Pulse rhythm:   regular BP sitting:   130 / 74  (right arm) Cuff size:   large  Vitals Entered By: Almeta Monas CMA Duncan Dull) (February 10, 2010 10:40 AM) CC: 2 week f/u to review labs   History of Present Illness: Pt here to go over labs and recheck bp.  Pt only complaints is ingrown hair back L leg.  Current Medications (verified): 1)  Calan Sr 240 Mg Cr-Tabs (Verapamil Hcl) .Marland Kitchen.. 1 By Mouth Once Daily. 2)  Aspirin 81 Mg Tbec (Aspirin) .Marland Kitchen.. 1 By Mouth Once Daily. 3)  Losartan Potassium 100 Mg Tabs (Losartan Potassium) .Marland Kitchen.. 1 By Mouth Once Daily 4)  Prozac 20 Mg Caps (Fluoxetine Hcl) .Marland Kitchen.. 1 By Mouth Daily. 5)  Zyprexa 5 Mg Tabs (Olanzapine) .Marland Kitchen.. 1 By Mouth Daily. 6)  Nicotrol 10 Mg Inha (Nicotine) .... As Directed-Up To 16 Cartridges A Day 7)  Lamisil 250 Mg Tabs (Terbinafine Hcl) .Marland Kitchen.. 1 By Mouth Once Daily  Allergies (verified): No Known Drug Allergies  Past History:  Past medical, surgical, family and social histories (including risk factors) reviewed for relevance to current acute and chronic problems.  Past Medical History: Reviewed history from 01/03/2009 and no changes required. Hypertension Current Problems:  BIPOLAR DISORDER UNSPECIFIED (ICD-296.80) SKIN RASH (ICD-782.1) PREVENTIVE HEALTH CARE (ICD-V70.0) FAMILY HISTORY DIABETES 1ST DEGREE RELATIVE (ICD-V18.0) HYPERTENSION (ICD-401.9)  Past Surgical History: Reviewed history from 01/16/2010 and no changes required. Denies surgical history  Family History: Reviewed history from 01/03/2009 and no changes required. Family History Diabetes 1st degree relative Family History Hypertension Family History of Prostate CA 1st degree relative <50  Social History: Reviewed history from 01/16/2010 and no changes required. 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 Lungs are clear to auscultation, no crackles or wheezes. Heart:  Normal rate and regular rhythm. S1 and S2 normal without gallop, murmur, click, rub or other extra sounds. Skin:  + Papule back L leg--draining no signs of infection Psych:  Cognition and judgment appear intact. Alert and cooperative with normal attention span and concentration. No apparent delusions, illusions, hallucinations   Impression & Recommendations:  Problem # 1:  HYPERTENSION (ICD-401.9) [facility] labs reviewed with pt  His updated medication list for this problem includes:    Calan Sr 240 Mg Cr-tabs (Verapamil hcl) .Marland Kitchen... 1 by mouth once daily.    Losartan Potassium 100 Mg Tabs (Losartan potassium) .Marland Kitchen... 1 by mouth once daily  BP today: 130/74 Prior BP: 172/110 (01/16/2010)  Labs Reviewed: K+: 4.1 (11/20/2009) Creat: : 1.2 (11/20/2009)   Chol: 143 (01/03/2009)   HDL: 49.00 (01/03/2009)   LDL: 60 (01/03/2009)   TG: 172.0 (01/03/2009)  Problem # 2:  FOLLICULITIS (ICD-704.8) warm soaks abx ointment rto prn  Complete Medication List: 1)  Calan Sr 240 Mg Cr-tabs (Verapamil hcl) .Marland Kitchen.. 1 by mouth once daily. 2)  Aspirin 81 Mg Tbec (Aspirin) .Marland Kitchen.. 1 by mouth once daily. 3)  Losartan Potassium 100 Mg Tabs (Losartan potassium) .Marland Kitchen.. 1 by mouth once daily 4)  Prozac 20 Mg Caps (Fluoxetine hcl) .Marland Kitchen.. 1 by mouth daily. 5)  Zyprexa 5  Mg Tabs (Olanzapine) .Marland Kitchen.. 1 by mouth daily. 6)  Nicotrol 10 Mg Inha (Nicotine) .... As directed-up to 16 cartridges a day 7)  Lamisil 250 Mg Tabs (Terbinafine hcl) .Marland Kitchen.. 1 by mouth once daily  Patient Instructions: 1)  Please schedule a follow-up appointment in 3 months .  Prescriptions: CALAN SR 240 MG CR-TABS  (VERAPAMIL HCL) 1 by mouth once daily.  #90 x 3   Entered and Authorized by:   Loreen Freud DO   Signed by:   Loreen Freud DO on 02/10/2010   Method used:   Print then Give to Patient   RxID:   [id number] LOSARTAN POTASSIUM 100 MG TABS (LOSARTAN POTASSIUM) 1 by mouth once daily  #90 x 3   Entered and Authorized by:   Loreen Freud DO   Signed by:   Loreen Freud DO on 02/10/2010   Method used:   Print then Give to Patient   RxID:   [id number]    Orders Added: 1)  Est. Patient Level III [84696]

## 2010-06-10 ENCOUNTER — Telehealth: Payer: Self-pay | Admitting: Family Medicine

## 2010-06-19 NOTE — Progress Notes (Signed)
Summary: refill  Phone Note Refill Request Call back at Home Phone [phone] Message from:  Patient  Refills Requested: Medication #1:  AMBIEN 10 MG TABS 1 by mouth every other day to every 3rd night as needed for sleep. [facility] [address]25[facility]10 filled #30. last OV 02-10-10  Initial call taken by: Jeremy Johann CMA,  June 10, 2010 11:14 AM  Follow-up for Phone Call        refill x1 Follow-up by: Loreen Freud DO,  June 10, 2010 11:33 AM    Prescriptions: AMBIEN 10 MG TABS (ZOLPIDEM TARTRATE) 1 by mouth every other day to every 3rd night as needed for sleep  #15 x 0   Entered by:   Almeta Monas CMA (AAMA)   Authorized by:   Loreen Freud DO   Signed by:   Almeta Monas CMA (AAMA) on 06/10/2010   Method used:   Printed then faxed to ...       [facility] [address] [id number]* (retail)       [address] [place], [place]  [address]       Ph: [phone]       Fax: [phone]   RxID:   [id number]

## 2010-08-08 ENCOUNTER — Telehealth: Payer: Self-pay | Admitting: Family Medicine

## 2010-08-08 MED ORDER — NICOTINE 10 MG IN INHA: 1.0000 | RESPIRATORY_TRACT | Status: DC | PRN

## 2010-08-08 NOTE — Telephone Encounter (Signed)
Patient wants refill for nicotrol - new pharmacy [facility] [address]

## 2010-08-16 ENCOUNTER — Encounter: Payer: Self-pay | Admitting: Family Medicine

## 2010-09-02 ENCOUNTER — Other Ambulatory Visit: Payer: Self-pay | Admitting: Family Medicine

## 2010-10-03 ENCOUNTER — Other Ambulatory Visit: Payer: Self-pay | Admitting: Family Medicine

## 2010-10-13 ENCOUNTER — Other Ambulatory Visit: Payer: Self-pay | Admitting: Family Medicine

## 2010-12-01 ENCOUNTER — Other Ambulatory Visit: Payer: Self-pay | Admitting: Family Medicine

## 2010-12-10 ENCOUNTER — Ambulatory Visit: Payer: Self-pay | Admitting: Family Medicine

## 2010-12-12 ENCOUNTER — Ambulatory Visit: Payer: Self-pay | Admitting: Family Medicine

## 2010-12-12 DIAGNOSIS — Z029 Encounter for administrative examinations, unspecified: Secondary | ICD-10-CM

## 2010-12-19 ENCOUNTER — Ambulatory Visit (INDEPENDENT_AMBULATORY_CARE_PROVIDER_SITE_OTHER): Payer: BC Managed Care – PPO | Admitting: Family Medicine

## 2010-12-19 ENCOUNTER — Other Ambulatory Visit: Payer: Self-pay | Admitting: Family Medicine

## 2010-12-19 ENCOUNTER — Encounter: Payer: Self-pay | Admitting: Family Medicine

## 2010-12-19 DIAGNOSIS — Z79899 Other long term (current) drug therapy: Secondary | ICD-10-CM

## 2010-12-19 DIAGNOSIS — Z202 Contact with and (suspected) exposure to infections with a predominantly sexual mode of transmission: Secondary | ICD-10-CM

## 2010-12-19 DIAGNOSIS — I10 Essential (primary) hypertension: Secondary | ICD-10-CM

## 2010-12-19 DIAGNOSIS — IMO0002 Reserved for concepts with insufficient information to code with codable children: Secondary | ICD-10-CM

## 2010-12-19 DIAGNOSIS — F3175 Bipolar disorder, in partial remission, most recent episode depressed: Secondary | ICD-10-CM

## 2010-12-19 DIAGNOSIS — J329 Chronic sinusitis, unspecified: Secondary | ICD-10-CM

## 2010-12-19 LAB — HEPATIC FUNCTION PANEL
AST: 27 U/L (ref 0–37)
Albumin: 4.2 g/dL (ref 3.5–5.2)
Alkaline Phosphatase: 46 U/L (ref 39–117)
Total Bilirubin: 0.5 mg/dL (ref 0.3–1.2)
Total Protein: 7 g/dL (ref 6.0–8.3)

## 2010-12-19 LAB — CBC WITH DIFFERENTIAL/PLATELET
Basophils Relative: 0 % (ref 0–1)
Eosinophils Absolute: 0.1 10*3/uL (ref 0.0–0.7)
Eosinophils Relative: 2 % (ref 0–5)
Lymphs Abs: 1.8 10*3/uL (ref 0.7–4.0)
MCH: 31.9 pg (ref 26.0–34.0)
MCHC: 32.9 g/dL (ref 30.0–36.0)
MCV: 97.1 fL (ref 78.0–100.0)
Monocytes Relative: 13 % — ABNORMAL HIGH (ref 3–12)
Neutrophils Relative %: 52 % (ref 43–77)
Platelets: 160 10*3/uL (ref 150–400)
RBC: 4.51 MIL/uL (ref 4.22–5.81)

## 2010-12-19 MED ORDER — VERAPAMIL HCL 240 MG (CO) PO TB24: 240.0000 mg | ORAL_TABLET | Freq: Every day | ORAL | Status: DC

## 2010-12-19 MED ORDER — CEFUROXIME AXETIL 500 MG PO TABS: 500.0000 mg | ORAL_TABLET | Freq: Two times a day (BID) | ORAL | Status: AC

## 2010-12-19 NOTE — Patient Instructions (Signed)

## 2010-12-19 NOTE — Progress Notes (Signed)
  Subjective:     Devon Hayden is a 40 y.o. male who presents for evaluation of sinus pain. Symptoms include: congestion, headaches, nasal congestion and sinus pressure. Onset of symptoms was 3 days ago. Symptoms have been gradually improving since that time. Past history is significant for no history of pneumonia or bronchitis. Patient is a former smoker, quit 3 months ago.  The following portions of the patient's history were reviewed and updated as appropriate: allergies, current medications, past family history, past medical history, past social history, past surgical history and problem list.  Review of Systems Pertinent items are noted in HPI.   Objective:    BP 150/100  Pulse 92  Temp(Src) 98.7 F (37.1 C) (Oral)  Wt 209 lb 9.6 oz (95.074 kg)  SpO2 97% General appearance: alert, cooperative, appears stated age and no distress Head: Normocephalic, without obvious abnormality, atraumatic Ears: normal TM's and external ear canals both ears Nose: green discharge, mild congestion, sinus tenderness bilateral Throat: lips, mucosa, and tongue normal; teeth and gums normal Neck: no adenopathy, no carotid bruit, no JVD, supple, symmetrical, trachea midline and thyroid not enlarged, symmetric, no tenderness/mass/nodules Lungs: clear to auscultation bilaterally Heart: S1, S2 normal Extremities: extremities normal, atraumatic, no cyanosis or edema    Assessment:    Acute bacterial sinusitis.  HTN--- increase covera HS Bipolar-- check labs per psych   Plan:    Nasal steroids per medication orders. Antihistamines per medication orders. Ceftin per medication orders. rto 2-3 weeks to recheck bp

## 2010-12-20 LAB — GC/CHLAMYDIA PROBE AMP, URINE: Chlamydia, Swab/Urine, PCR: NEGATIVE

## 2010-12-23 ENCOUNTER — Telehealth: Payer: Self-pay | Admitting: *Deleted

## 2010-12-23 MED ORDER — LOSARTAN POTASSIUM 50 MG PO TABS: 50.0000 mg | ORAL_TABLET | Freq: Every day | ORAL | Status: DC

## 2010-12-23 NOTE — Telephone Encounter (Signed)
Add cozaar 50 mg 1 po qd  #30   -----pt should have ov 2-3 weeks already scheduled

## 2010-12-23 NOTE — Telephone Encounter (Signed)
Left message to call office

## 2010-12-23 NOTE — Telephone Encounter (Signed)
Pt states that Dr Laury Axon was to increase covera from 180 mg to 240 mg to help controlled BP med. Pt notes that he is already taking the 240 mg and his BP is still high..Please advise

## 2010-12-24 ENCOUNTER — Other Ambulatory Visit: Payer: Self-pay | Admitting: Family Medicine

## 2010-12-24 LAB — VALPROIC ACID LEVEL: Valproic Acid Lvl: 90.2 ug/mL (ref 50.0–100.0)

## 2010-12-24 MED ORDER — VERAPAMIL HCL 180 MG (CO) PO TB24: ORAL_TABLET | ORAL | Status: DC

## 2010-12-24 NOTE — Telephone Encounter (Signed)
mssg left to call the office    KP 

## 2010-12-24 NOTE — Telephone Encounter (Signed)
Please review lab results. Pt requesting results  Discuss with patient, appt already scheduled.

## 2010-12-24 NOTE — Telephone Encounter (Signed)
Please update med list-- pt needs ov --we need to change meds---how high is it?

## 2010-12-24 NOTE — Telephone Encounter (Signed)
Too high!!!  Pt should not fly.   Increase covera to 180  3 po qd  Keep appointment

## 2010-12-24 NOTE — Telephone Encounter (Signed)
Pt states that BP has been running 148/105. Pt denies any SOB,headache, chest pain, numbness or tingling in arms and legs. Pt notes that he is flying out in am and will not return until late Monday. Pt schedule for OV on Tuesday.

## 2010-12-24 NOTE — Telephone Encounter (Signed)
Pt called back to report that he is already taking LOSARTAN POTASSIUM 100 MG TABS along with verapamil 240 mg and BP is still running high. Please advise

## 2010-12-24 NOTE — Telephone Encounter (Signed)
I already sent rx to pharmacy

## 2010-12-24 NOTE — Telephone Encounter (Signed)
See labs 

## 2010-12-25 MED ORDER — VERAPAMIL HCL 180 MG (CO) PO TB24: 540.0000 mg | ORAL_TABLET | Freq: Every day | ORAL | Status: DC

## 2010-12-25 NOTE — Telephone Encounter (Signed)
lmom     KP 

## 2010-12-25 NOTE — Telephone Encounter (Signed)
Pt return call and is already out of town. Pt is on 240 mg not 180 mg would you like to change this med. Please advise

## 2010-12-25 NOTE — Telephone Encounter (Signed)
We can call in 180 mg  To pharmacy close to him.

## 2010-12-25 NOTE — Telephone Encounter (Signed)
Pt return call Rx faxed to [phone]

## 2010-12-25 NOTE — Telephone Encounter (Signed)
Left message to call office

## 2010-12-30 ENCOUNTER — Encounter (INDEPENDENT_AMBULATORY_CARE_PROVIDER_SITE_OTHER): Payer: BC Managed Care – PPO | Admitting: Family Medicine

## 2010-12-30 NOTE — Progress Notes (Signed)
This encounter was created in error - please disregard.

## 2010-12-31 ENCOUNTER — Inpatient Hospital Stay (HOSPITAL_COMMUNITY)
Admission: EM | Admit: 2010-12-31 | Discharge: 2011-01-03 | DRG: 582 | Disposition: A | Discharge status: Home / Self Care | Payer: BC Managed Care – PPO | Attending: Internal Medicine | Admitting: Internal Medicine

## 2010-12-31 DIAGNOSIS — E872 Acidosis, unspecified: Secondary | ICD-10-CM | POA: Diagnosis present

## 2010-12-31 DIAGNOSIS — N289 Disorder of kidney and ureter, unspecified: Secondary | ICD-10-CM | POA: Diagnosis present

## 2010-12-31 DIAGNOSIS — I498 Other specified cardiac arrhythmias: Secondary | ICD-10-CM | POA: Diagnosis present

## 2010-12-31 DIAGNOSIS — N170 Acute kidney failure with tubular necrosis: Secondary | ICD-10-CM

## 2010-12-31 DIAGNOSIS — I959 Hypotension, unspecified: Secondary | ICD-10-CM

## 2010-12-31 DIAGNOSIS — T465X1A Poisoning by other antihypertensive drugs, accidental (unintentional), initial encounter: Principal | ICD-10-CM | POA: Diagnosis present

## 2010-12-31 DIAGNOSIS — Y92009 Unspecified place in unspecified non-institutional (private) residence as the place of occurrence of the external cause: Secondary | ICD-10-CM

## 2010-12-31 DIAGNOSIS — N179 Acute kidney failure, unspecified: Secondary | ICD-10-CM | POA: Diagnosis present

## 2010-12-31 DIAGNOSIS — I10 Essential (primary) hypertension: Secondary | ICD-10-CM | POA: Diagnosis present

## 2010-12-31 DIAGNOSIS — F319 Bipolar disorder, unspecified: Secondary | ICD-10-CM | POA: Diagnosis present

## 2010-12-31 DIAGNOSIS — Z8546 Personal history of malignant neoplasm of prostate: Secondary | ICD-10-CM

## 2010-12-31 DIAGNOSIS — F17200 Nicotine dependence, unspecified, uncomplicated: Secondary | ICD-10-CM | POA: Diagnosis present

## 2010-12-31 DIAGNOSIS — N390 Urinary tract infection, site not specified: Secondary | ICD-10-CM | POA: Diagnosis present

## 2010-12-31 DIAGNOSIS — T46901A Poisoning by unspecified agents primarily affecting the cardiovascular system, accidental (unintentional), initial encounter: Secondary | ICD-10-CM | POA: Diagnosis present

## 2010-12-31 LAB — CBC
MCH: 33.1 pg (ref 26.0–34.0)
MCHC: 35.2 g/dL (ref 30.0–36.0)
Platelets: 173 10*3/uL (ref 150–400)
RDW: 13 % (ref 11.5–15.5)

## 2010-12-31 LAB — COMPREHENSIVE METABOLIC PANEL
Albumin: 3.2 g/dL — ABNORMAL LOW (ref 3.5–5.2)
BUN: 10 mg/dL (ref 6–23)
Calcium: 8.6 mg/dL (ref 8.4–10.5)
Chloride: 103 mEq/L (ref 96–112)
Creatinine, Ser: 2.64 mg/dL — ABNORMAL HIGH (ref 0.50–1.35)
GFR calc Af Amer: 33 mL/min — ABNORMAL LOW (ref >60)
GFR calc non Af Amer: 27 mL/min — ABNORMAL LOW (ref >60)
Total Protein: 6.2 g/dL (ref 6.0–8.3)

## 2010-12-31 LAB — RAPID URINE DRUG SCREEN, HOSP PERFORMED
Amphetamines: POSITIVE — AB
Barbiturates: NOT DETECTED
Benzodiazepines: NOT DETECTED
Cocaine: NOT DETECTED
Opiates: NOT DETECTED
Tetrahydrocannabinol: NOT DETECTED

## 2010-12-31 LAB — URINALYSIS, ROUTINE W REFLEX MICROSCOPIC
Glucose, UA: 100 mg/dL — AB
Protein, ur: 100 mg/dL — AB
Specific Gravity, Urine: 1.025 (ref 1.005–1.030)
Urobilinogen, UA: 1 mg/dL (ref 0.0–1.0)

## 2010-12-31 LAB — POCT I-STAT TROPONIN I: Troponin i, poc: 0 ng/mL (ref 0.00–0.08)

## 2010-12-31 LAB — DIFFERENTIAL
Basophils Absolute: 0 10*3/uL (ref 0.0–0.1)
Basophils Relative: 0 % (ref 0–1)
Eosinophils Absolute: 0 10*3/uL (ref 0.0–0.7)
Monocytes Absolute: 0.6 10*3/uL (ref 0.1–1.0)
Monocytes Relative: 9 % (ref 3–12)

## 2010-12-31 LAB — URINE MICROSCOPIC-ADD ON

## 2011-01-01 ENCOUNTER — Inpatient Hospital Stay (HOSPITAL_COMMUNITY): Payer: BC Managed Care – PPO

## 2011-01-01 DIAGNOSIS — I498 Other specified cardiac arrhythmias: Secondary | ICD-10-CM

## 2011-01-01 DIAGNOSIS — I509 Heart failure, unspecified: Secondary | ICD-10-CM

## 2011-01-01 LAB — BASIC METABOLIC PANEL
CO2: 18 mEq/L — ABNORMAL LOW (ref 19–32)
Calcium: 7.4 mg/dL — ABNORMAL LOW (ref 8.4–10.5)
Calcium: 8.3 mg/dL — ABNORMAL LOW (ref 8.4–10.5)
Creatinine, Ser: 2.44 mg/dL — ABNORMAL HIGH (ref 0.50–1.35)
GFR calc Af Amer: 52 mL/min — ABNORMAL LOW (ref >60)
GFR calc non Af Amer: 43 mL/min — ABNORMAL LOW (ref >60)
Sodium: 139 mEq/L (ref 135–145)

## 2011-01-01 LAB — GLUCOSE, CAPILLARY
Glucose-Capillary: 320 mg/dL — ABNORMAL HIGH (ref 70–99)
Glucose-Capillary: 277 mg/dL — ABNORMAL HIGH (ref 70–99)
Glucose-Capillary: 198 mg/dL — ABNORMAL HIGH (ref 70–99)
Glucose-Capillary: 104 mg/dL — ABNORMAL HIGH (ref 70–99)
Glucose-Capillary: 105 mg/dL — ABNORMAL HIGH (ref 70–99)
Glucose-Capillary: 107 mg/dL — ABNORMAL HIGH (ref 70–99)
Glucose-Capillary: 108 mg/dL — ABNORMAL HIGH (ref 70–99)

## 2011-01-01 LAB — CARDIAC PANEL(CRET KIN+CKTOT+MB+TROPI): Total CK: 102 U/L (ref 7–232)

## 2011-01-01 LAB — LACTIC ACID, PLASMA: Lactic Acid, Venous: 2.8 mmol/L — ABNORMAL HIGH (ref 0.5–2.2)

## 2011-01-01 LAB — VALPROIC ACID LEVEL: Valproic Acid Lvl: 23.3 ug/mL — ABNORMAL LOW (ref 50.0–100.0)

## 2011-01-01 LAB — MRSA PCR SCREENING: MRSA by PCR: NEGATIVE

## 2011-01-02 ENCOUNTER — Ambulatory Visit: Payer: BC Managed Care – PPO | Admitting: Family Medicine

## 2011-01-02 LAB — COMPREHENSIVE METABOLIC PANEL
ALT: 33 U/L (ref 0–53)
CO2: 28 mEq/L (ref 19–32)
Calcium: 8.6 mg/dL (ref 8.4–10.5)
Chloride: 104 mEq/L (ref 96–112)
Creatinine, Ser: 1.61 mg/dL — ABNORMAL HIGH (ref 0.50–1.35)
GFR calc Af Amer: 58 mL/min — ABNORMAL LOW (ref >60)
GFR calc non Af Amer: 48 mL/min — ABNORMAL LOW (ref >60)
Glucose, Bld: 93 mg/dL (ref 70–99)
Total Bilirubin: 0.4 mg/dL (ref 0.3–1.2)

## 2011-01-02 LAB — CBC
Hemoglobin: 12 g/dL — ABNORMAL LOW (ref 13.0–17.0)
MCH: 32.4 pg (ref 26.0–34.0)
MCV: 94.9 fL (ref 78.0–100.0)
RBC: 3.7 MIL/uL — ABNORMAL LOW (ref 4.22–5.81)
WBC: 6.4 10*3/uL (ref 4.0–10.5)

## 2011-01-02 LAB — GLUCOSE, CAPILLARY
Glucose-Capillary: 121 mg/dL — ABNORMAL HIGH (ref 70–99)
Glucose-Capillary: 115 mg/dL — ABNORMAL HIGH (ref 70–99)
Glucose-Capillary: 117 mg/dL — ABNORMAL HIGH (ref 70–99)

## 2011-01-03 LAB — BASIC METABOLIC PANEL
BUN: 11 mg/dL (ref 6–23)
Chloride: 102 mEq/L (ref 96–112)
GFR calc Af Amer: >60 mL/min (ref >60)
Potassium: 4 mEq/L (ref 3.5–5.1)

## 2011-01-03 LAB — GLUCOSE, CAPILLARY
Glucose-Capillary: 127 mg/dL — ABNORMAL HIGH (ref 70–99)
Glucose-Capillary: 93 mg/dL (ref 70–99)

## 2011-01-05 ENCOUNTER — Telehealth: Payer: Self-pay

## 2011-01-05 LAB — GLUCOSE, CAPILLARY
Glucose-Capillary: 130 mg/dL — ABNORMAL HIGH (ref 70–99)
Glucose-Capillary: 138 mg/dL — ABNORMAL HIGH (ref 70–99)

## 2011-01-05 NOTE — Telephone Encounter (Signed)
Pt called back and an appt was made.

## 2011-01-05 NOTE — Telephone Encounter (Signed)
Pt called and stated she was in to see Dr. Beverely Low last week and was taken off Effexor and

## 2011-01-05 NOTE — Telephone Encounter (Signed)
Pt called and stated he has some bp concerns. Pt states he had to go to the hospital on 12/31/10. Called and left a message on pt's cell phone for pt to return call.

## 2011-01-05 NOTE — Telephone Encounter (Signed)
Opened in error

## 2011-01-06 ENCOUNTER — Encounter: Payer: Self-pay | Admitting: Family Medicine

## 2011-01-06 ENCOUNTER — Ambulatory Visit (INDEPENDENT_AMBULATORY_CARE_PROVIDER_SITE_OTHER): Payer: BC Managed Care – PPO | Admitting: Family Medicine

## 2011-01-06 VITALS — BP 150/100 | HR 74 | Temp 98.7°F | Wt 211.0 lb

## 2011-01-06 DIAGNOSIS — I10 Essential (primary) hypertension: Secondary | ICD-10-CM

## 2011-01-06 LAB — BASIC METABOLIC PANEL
Calcium: 9.3 mg/dL (ref 8.4–10.5)
GFR: 66.94 mL/min (ref >60.00)
Sodium: 140 mEq/L (ref 135–145)

## 2011-01-06 NOTE — Patient Instructions (Signed)

## 2011-01-06 NOTE — Progress Notes (Signed)
  Subjective:    Patient ID: Devon Hayden, male    DOB: 1970-08-27, 40 y.o.   MRN: [id number]  HPI    Review of Systems     Objective:   Physical Exam        Assessment & Plan:   Subjective:    Patient here for follow-up of elevated blood pressure.  He is not exercising and is adherent to a low-salt diet.  Blood pressure is well controlled at home. Cardiac symptoms: none. Patient denies: chest pain, chest pressure/discomfort, claudication, dyspnea, exertional chest pressure/discomfort, fatigue, irregular heart beat, lower extremity edema, near-syncope, orthopnea, palpitations, paroxysmal nocturnal dyspnea, syncope and tachypnea. Cardiovascular risk factors: hypertension and sedentary lifestyle. Use of agents associated with hypertension: none. History of target organ damage: none. Pt just released from hospital for low bp, and renal insufficiency.   Pt d/c on norvasc and cozaar x 5 days only.  No complaints.  BP running about 130/88-90 .  The following portions of the patient's history were reviewed and updated as appropriate: allergies, current medications, past family history, past medical history, past social history, past surgical history and problem list.  Review of Systems Pertinent items are noted in HPI.     Objective:    BP 150/100  Pulse 74  Temp(Src) 98.7 F (37.1 C) (Oral)  Wt 211 lb (95.709 kg)  SpO2 98% General appearance: alert, cooperative, appears stated age and no distress Lungs: clear to auscultation bilaterally Heart: S1, S2 normal Extremities: extremities normal, atraumatic, no cyanosis or edema    Assessment:    Hypertension, stage 2 . Evidence of target organ damage: chronic kidney disease.    Plan:    Medication: no change. Dietary sodium restriction. Regular aerobic exercise. Check blood pressures 1 times daily and record. Follow up: 1 week and as needed. --- check bmp

## 2011-01-07 ENCOUNTER — Telehealth: Payer: Self-pay | Admitting: *Deleted

## 2011-01-07 LAB — HEMOGLOBIN A1C: Mean Plasma Glucose: 123 mg/dL

## 2011-01-07 NOTE — Telephone Encounter (Signed)
Pt left VM that he thought that Dr Laury Axon changed BP med but Rx was not at pharmacy.. Left message to call office. Per Dr Laury Axon no change in med, continue to monitor BP and if BP is still elevated to give [place] a call, and keep f/u appt for BP check.

## 2011-01-08 NOTE — Telephone Encounter (Signed)
Pt made aware by kim

## 2011-01-13 ENCOUNTER — Encounter: Payer: Self-pay | Admitting: Family Medicine

## 2011-01-13 ENCOUNTER — Ambulatory Visit: Payer: BC Managed Care – PPO | Admitting: Family Medicine

## 2011-01-20 NOTE — Discharge Summary (Signed)
  NAME:  Devon Hayden, BROADFOOT NO.:  [id number]  MEDICAL RECORD NO.:  [id number]  LOCATION:  3703                         FACILITY:  [facility]  PHYSICIAN:  Lonia Blood, M.D.       DATE OF BIRTH:  17-Feb-1971  DATE OF ADMISSION:  12/31/2010 DATE OF DISCHARGE:  01/03/2011                              DISCHARGE SUMMARY   PRIMARY CARE PHYSICIAN:  Lelon Perla, DO.  DISCHARGE DIAGNOSES: 1. Calcium channel blocker, accidental overdose with bradycardia and     hypotension, resolved. 2. Junctional rhythm, resolved. 3. Bipolar disorder, stable. 4. Mild acute renal failure on admission, resolved. 5. Mild metabolic acidosis on admission, resolved.  DISCHARGE MEDICATIONS: 1. Amlodipine 10 mg daily. 2. Ambien 10 mg by mouth daily at bedtime as needed for sleep. 3. Cozaar 100 mg daily. 4. Depakote 1500 mg daily at bedtime. 5. Lamictal 100 mg by mouth daily.  CONDITION ON DISCHARGE:  [name] was discharged in good condition.  Alert, oriented, in no acute distress.  Hemodynamically stable, in sinus rhythm.  The patient will follow up with his primary care physician Dr. Laury Axon.  HISTORY AND PHYSICAL:  The typed history and physical is available done by the critical care physician, Dr. Marin Shutter.  Briefly, [name] presented to the emergency room on December 31, 2010 with hypotension and bradycardia after his verapamil dose was increased.  He denies any accidental or intentional overdose on this calcium channel blocker.  HOSPITAL COURSE:  [name] was admitted to the intensive care unit, started on dopamine drip.  He was also given glucagon and he had an arterial line placed for closer monitoring of his blood pressure and heart rate.  His initial blood work was remarkable for a normal white blood cell count, low hemoglobin, and slightly elevated creatinine of 2.64.  His EKG showed a junctional rhythm.  Mr. Lacko was monitored in the intensive  care unit for 48 hours, and once he became stable, he will be discharged to the floor.  By January 03, 2011, the patient was hemodynamically stable with sinus rhythm and asymptomatic.  He was advised to take Cozaar and amlodipine for hypertension, continue his Depakote and Lamictal for bipolar disorder, and follow up closely with his primary care physician.     Lonia Blood, M.D.     SL/MEDQ  D:  01/15/2011  T:  01/15/2011  Job:  [id number]  cc:   Lelon Perla, DO  Electronically Signed by Lonia Blood M.D. on 01/20/2011 05:07:39 PM

## 2011-01-29 ENCOUNTER — Telehealth: Payer: Self-pay

## 2011-01-29 LAB — DIFFERENTIAL
Basophils Relative: 1
Lymphs Abs: 3.1
Monocytes Absolute: 0.9 — ABNORMAL HIGH
Monocytes Relative: 9
Neutro Abs: 5

## 2011-01-29 LAB — COMPREHENSIVE METABOLIC PANEL
ALT: 39
Albumin: 4.2
Alkaline Phosphatase: 44
BUN: 7
Calcium: 9.3
Potassium: 3.1 — ABNORMAL LOW
Sodium: 137
Total Protein: 6.8

## 2011-01-29 LAB — URINALYSIS, ROUTINE W REFLEX MICROSCOPIC
Bilirubin Urine: NEGATIVE
Glucose, UA: NEGATIVE
Hgb urine dipstick: NEGATIVE
Ketones, ur: NEGATIVE
Protein, ur: NEGATIVE
Urobilinogen, UA: 0.2

## 2011-01-29 LAB — CK TOTAL AND CKMB (NOT AT ARMC): Relative Index: 0.6

## 2011-01-29 LAB — CBC
Platelets: 250
RDW: 13

## 2011-01-29 LAB — POCT CARDIAC MARKERS
CKMB, poc: <1 — ABNORMAL LOW
Myoglobin, poc: 84.3
Operator id: 208461

## 2011-01-29 MED ORDER — LOSARTAN POTASSIUM 100 MG PO TABS: 100.0000 mg | ORAL_TABLET | Freq: Every day | ORAL | Status: DC

## 2011-01-29 MED ORDER — AMLODIPINE BESYLATE 10 MG PO TABS: 10.0000 mg | ORAL_TABLET | Freq: Every day | ORAL | Status: DC

## 2011-01-29 NOTE — Telephone Encounter (Signed)
msg left for patient to call back because the pharmacy number was incorrect    KP

## 2011-01-29 NOTE — Telephone Encounter (Signed)
Discussed with patient and the got the correct pharmacy, advise 90 days supply faxed and reminded patient to be sure to follow up with STD people and he agreed   KP

## 2011-01-29 NOTE — Telephone Encounter (Signed)
Yes---send refill x1---pt really needs bp rechecked

## 2011-01-29 NOTE — Telephone Encounter (Signed)
Call from patient and he stated he has Left town and is now in [place], Not sure if the move will e permanent yet but he needs his paperwork complete so he can get paid. He has not been pain in the last few weeks. He stated his situation in [place] was too much and he had to get away for a while. He a

## 2011-01-29 NOTE — Telephone Encounter (Signed)
He stated is is almost out of Amlodipine and Norvasc and would like both faxed to the pharmacy in [place] he stated he uses [facility] [phone]. He has been made aware we can not complete paperwork until he is seen and he voice understanding. He stated that he will contact them and see what they need . Please advise if it is ok to send Rx    KP

## 2012-05-19 IMAGING — US US RENAL PORT
1 series · 14 of 21 positions shown · non-contrast
Comparison: None.

CLINICAL DATA: Elevated creatinine, evaluate for hydronephrosis

RENAL/URINARY TRACT ULTRASOUND COMPLETE

[Series 1: us renal port · 0.26mm/px · 14 of 21 slices shown]
[im 1/21]
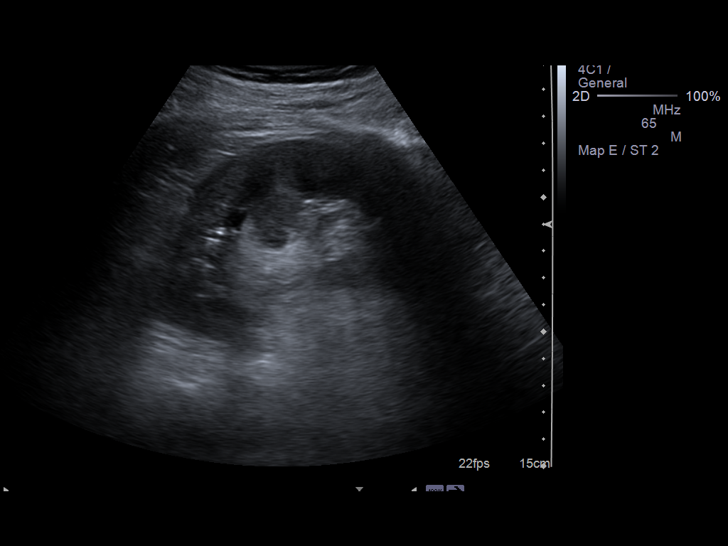
[im 3/21]
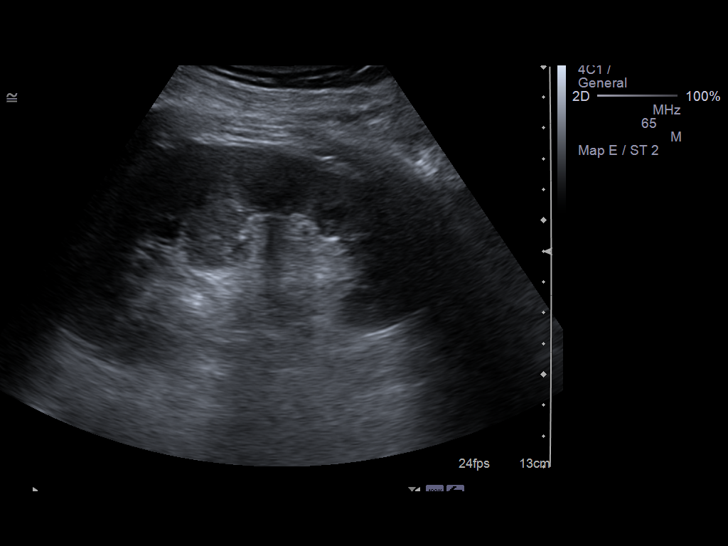
[im 4/21]
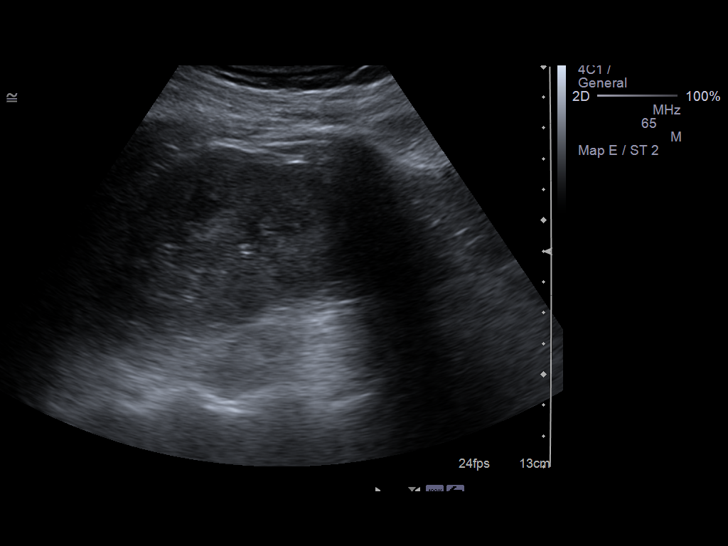
[im 6/21]
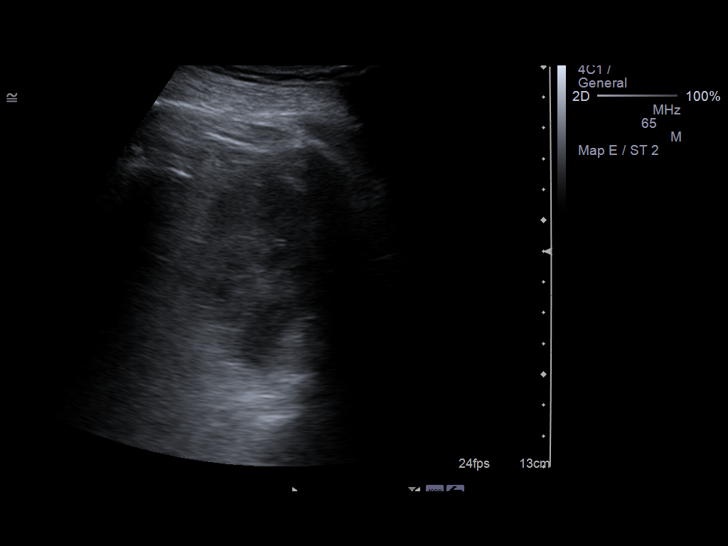
[im 7/21]
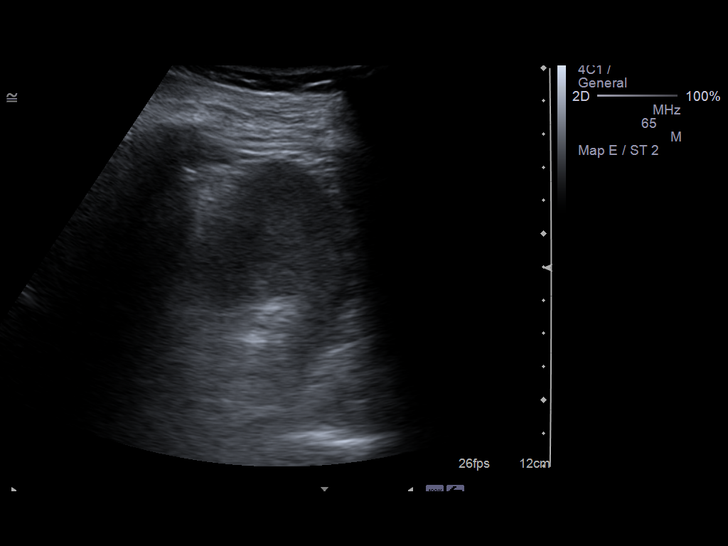
[im 9/21]
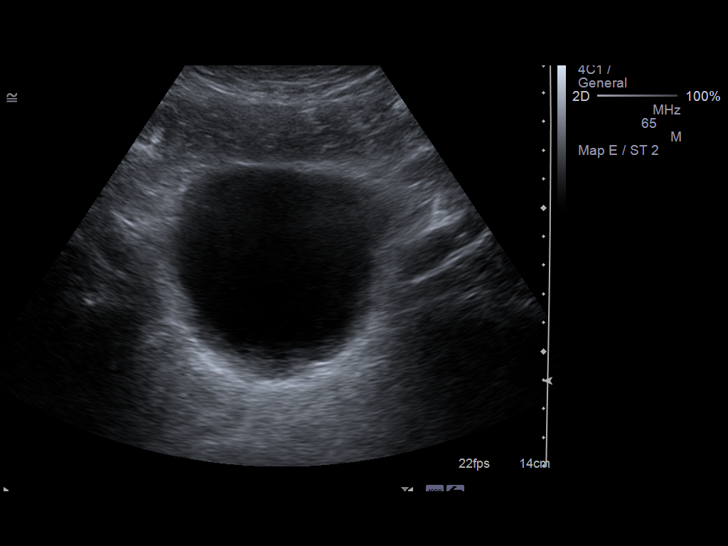
[im 10/21]
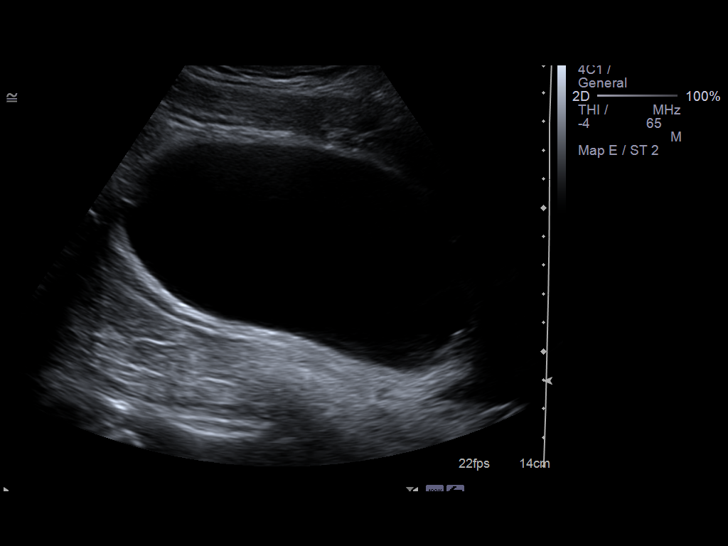
[im 12/21]
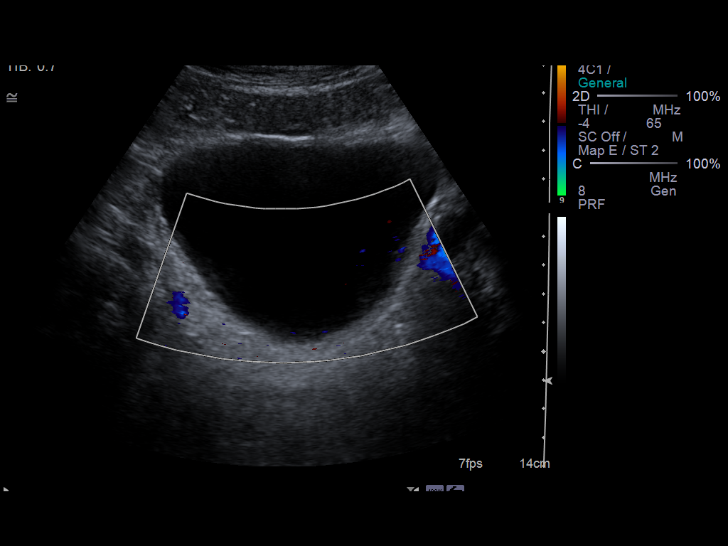
[im 13/21]
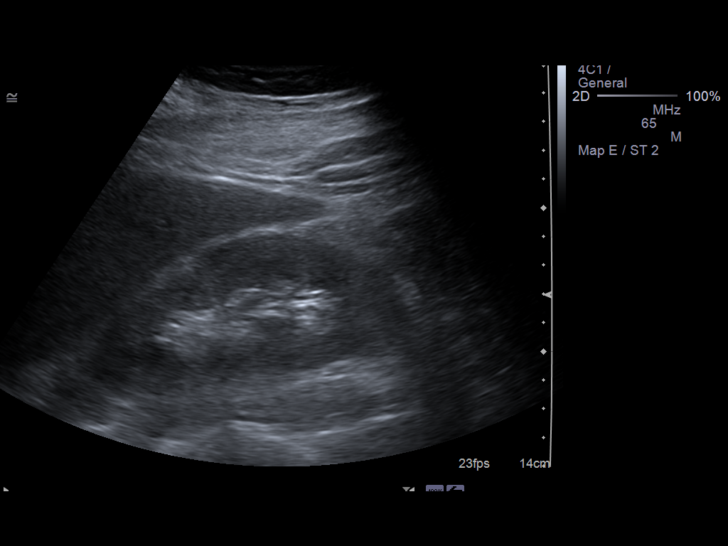
[im 15/21]
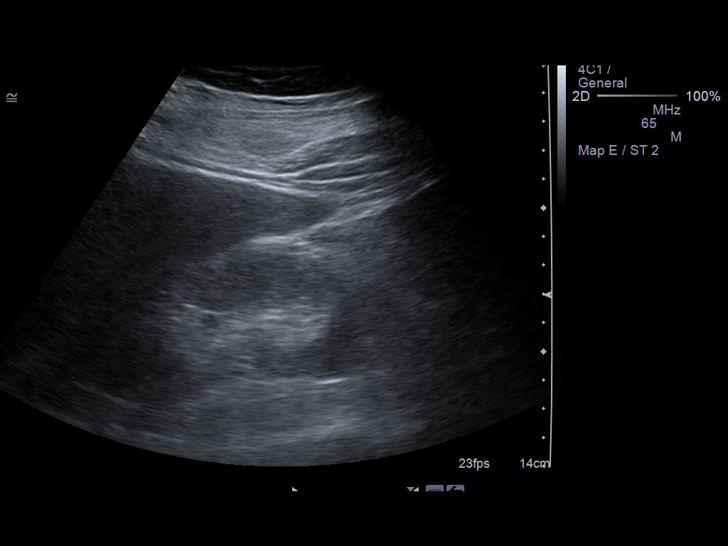
[im 16/21]
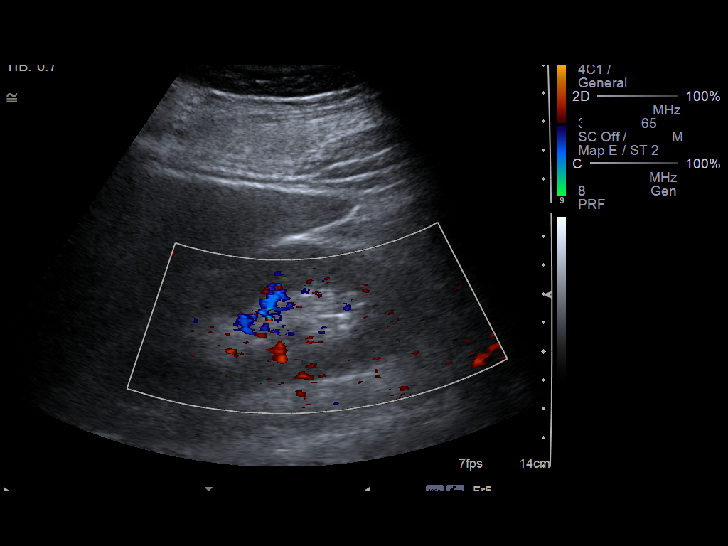
[im 18/21]
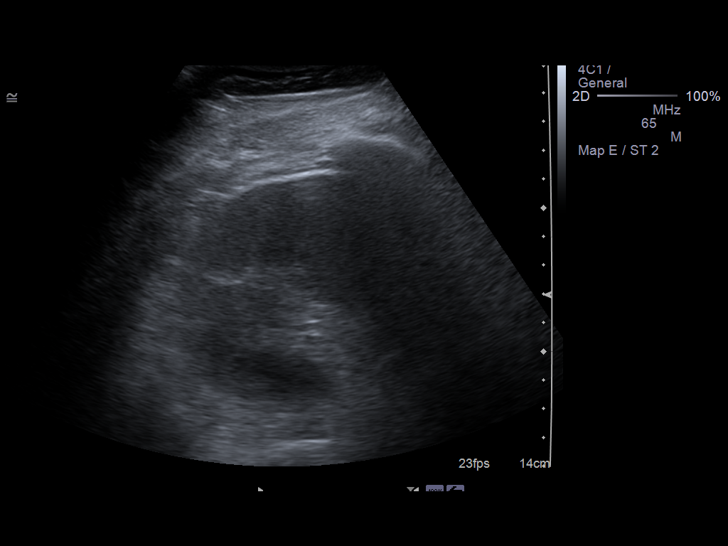
[im 19/21]
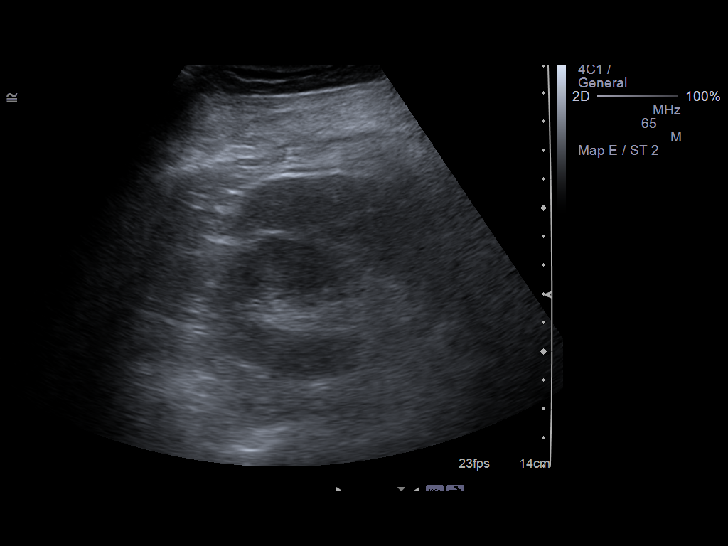
[im 21/21]
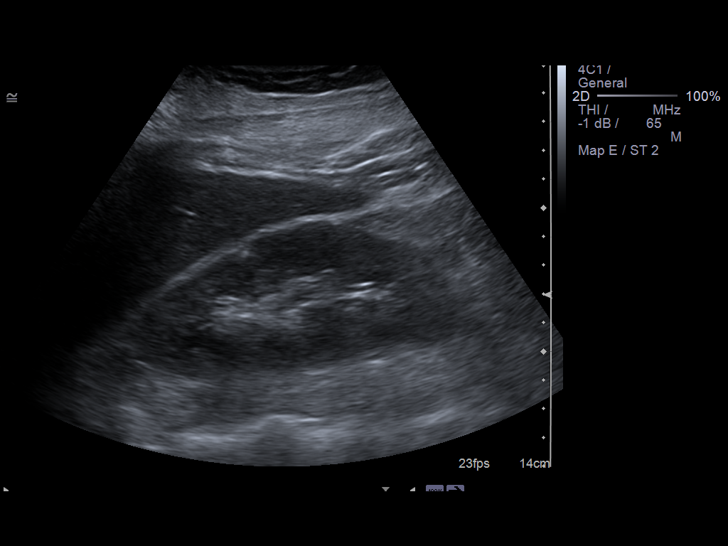

[14 of 21 positions shown; findings below may reference images not displayed]

FINDINGS: Right Kidney:  Measures 11.6 cm.  No mass or hydronephrosis.

Left Kidney:  Measures 12.7 cm. Prominent column of Bertin.  No
mass or hydronephrosis.

Bladder:  Within normal limits.
IMPRESSION: Normal sonographic appearance of the bilateral kidneys in the
setting of elevated creatinine.

No hydronephrosis.

## 2014-02-28 ENCOUNTER — Ambulatory Visit: Payer: Self-pay | Admitting: Unknown Physician Specialty

## 2015-11-07 ENCOUNTER — Encounter: Payer: Self-pay | Admitting: Urology

## 2015-11-07 ENCOUNTER — Ambulatory Visit: Payer: Self-pay | Admitting: Urology

## 2017-11-08 ENCOUNTER — Encounter (HOSPITAL_COMMUNITY): Payer: Self-pay

## 2017-11-08 ENCOUNTER — Emergency Department (HOSPITAL_COMMUNITY)
Admission: EM | Admit: 2017-11-08 | Discharge: 2017-11-09 | Disposition: A | Discharge status: Home / Self Care | Payer: BLUE CROSS/BLUE SHIELD | Attending: Emergency Medicine | Admitting: Emergency Medicine

## 2017-11-08 DIAGNOSIS — I10 Essential (primary) hypertension: Secondary | ICD-10-CM | POA: Insufficient documentation

## 2017-11-08 DIAGNOSIS — Z79899 Other long term (current) drug therapy: Secondary | ICD-10-CM | POA: Diagnosis not present

## 2017-11-08 DIAGNOSIS — L03114 Cellulitis of left upper limb: Secondary | ICD-10-CM

## 2017-11-08 DIAGNOSIS — R2232 Localized swelling, mass and lump, left upper limb: Secondary | ICD-10-CM | POA: Diagnosis present

## 2017-11-08 DIAGNOSIS — Z87891 Personal history of nicotine dependence: Secondary | ICD-10-CM | POA: Insufficient documentation

## 2017-11-08 NOTE — ED Triage Notes (Signed)
Pt states that he was bit by something yesterday on his L wrist, redness, warmth and swelling.

## 2017-11-09 LAB — CBC
HEMATOCRIT: 47.2 % (ref 39.0–52.0)
HEMOGLOBIN: 15.5 g/dL (ref 13.0–17.0)
MCH: 31.3 pg (ref 26.0–34.0)
MCHC: 32.8 g/dL (ref 30.0–36.0)
MCV: 95.4 fL (ref 78.0–100.0)
Platelets: 214 10*3/uL (ref 150–400)
RBC: 4.95 MIL/uL (ref 4.22–5.81)
RDW: 13.8 % (ref 11.5–15.5)
WBC: 7 10*3/uL (ref 4.0–10.5)

## 2017-11-09 LAB — C-REACTIVE PROTEIN: CRP: 1.1 mg/dL — ABNORMAL HIGH (ref <1.0)

## 2017-11-09 LAB — I-STAT CG4 LACTIC ACID, ED: Lactic Acid, Venous: 1.6 mmol/L (ref 0.5–1.9)

## 2017-11-09 MED ORDER — IBUPROFEN 400 MG PO TABS: 400.0000 mg | ORAL_TABLET | Freq: Four times a day (QID) | ORAL | 0 refills | Status: DC | PRN

## 2017-11-09 MED ORDER — CLINDAMYCIN HCL 150 MG PO CAPS: 300.0000 mg | ORAL_CAPSULE | Freq: Three times a day (TID) | ORAL | 0 refills | Status: DC

## 2017-11-09 MED ORDER — CLINDAMYCIN PHOSPHATE 600 MG/50ML IV SOLN
600.0000 mg | Freq: Once | INTRAVENOUS | Status: AC
  Administered 2017-11-09: 600 mg via INTRAVENOUS
  Filled 2017-11-09: qty 50

## 2017-11-09 NOTE — ED Provider Notes (Signed)
[facility] Provider Note   CSN: [id number] Arrival date & time: 11/08/17  2205    History   Chief Complaint Chief Complaint  Patient presents with  . Insect Bite    HPI Devon Hayden is a 47 y.o. male.  47 year old male with a history of hypertension presents to the emergency department for evaluation of swelling and redness to his left upper extremity.  He reports noticing a puncture wound to his wrist yesterday.  He is unsure of what bit him or how he sustained to this.  He noticed some soreness to the area this morning with associated swelling and redness which has been progressively worse.  He denies taking any medications for his symptoms.  There has been some month to the area.  No drainage.  He expresses concern for spider bite.  No associated fevers, numbness, paresthesias.     Past Medical History:  Diagnosis Date  . Bipolar disorder, unspecified (HCC)   . Hypertension     Patient Active Problem List   Diagnosis Date Noted  . FOLLICULITIS 02/10/2010  . ECZEMA 01/16/2010  . TOBACCO USE, QUIT 01/16/2010  . LIVER FUNCTION TESTS, ABNORMAL, HX OF 11/20/2009  . ONYCHOMYCOSIS, BILATERAL 11/08/2009  . TOBACCO ABUSE 02/04/2009  . BIPOLAR DISORDER UNSPECIFIED 01/03/2009  . HYPERTENSION 01/03/2009  . SKIN RASH 01/03/2009    History reviewed. No pertinent surgical history.      Home Medications    Prior to Admission medications   Medication Sig Start Date End Date Taking? Authorizing Provider  buPROPion (WELLBUTRIN XL) 150 MG 24 hr tablet Take 150 mg by mouth every morning. 10/18/17 10/18/18 Yes [provider]  losartan-hydrochlorothiazide (HYZAAR) 100-12.5 MG tablet Take 1 tablet by mouth daily. 09/02/17  Yes [provider]  testosterone cypionate (DEPO-TESTOSTERONE) 200 MG/ML injection Inject 200 mg into the muscle every Saturday. 10/18/17  Yes [provider]  amLODipine (NORVASC) 10 MG  tablet Take 1 tablet (10 mg total) by mouth daily. Patient not taking: Reported on 11/09/2017 01/29/11   Zola ButtonLowne Chase, Grayling CongressYvonne R, DO  clindamycin (CLEOCIN) 150 MG capsule Take 2 capsules (300 mg total) by mouth 3 (three) times daily. May dispense as 150mg  capsules 11/09/17   Antony MaduraHumes, Alvester Eads, PA-C  ibuprofen (ADVIL,MOTRIN) 400 MG tablet Take 1 tablet (400 mg total) by mouth every 6 (six) hours as needed for mild pain or moderate pain. 11/09/17   Antony MaduraHumes, Fajr Fife, PA-C  losartan (COZAAR) 100 MG tablet Take 1 tablet (100 mg total) by mouth daily. Patient not taking: Reported on 11/09/2017 01/29/11   Donato SchultzLowne Chase, Yvonne R, DO    Family History Family History  Problem Relation Age of Onset  . Diabetes Unknown        fam hx 1st degree relative  . Hypertension Unknown   . Prostate cancer Unknown   . Cancer Unknown        prostate 1st degree relative    Social History Social History   Tobacco Use  . Smoking status: Former Games developermoker  . Smokeless tobacco: Never Used  Substance Use Topics  . Alcohol use: Yes  . Drug use: Never     Allergies   Patient has no known allergies.   Review of Systems Review of Systems Ten systems reviewed and are negative for acute change, except as noted in the HPI.    Physical Exam Updated Vital Signs BP (!) 146/107 (BP Location: Right Arm)   Pulse 62   Temp 98.3 F (36.8  C) (Oral)   Resp 18   SpO2 100%   Physical Exam  Constitutional: He is oriented to person, place, and time. He appears well-developed and well-nourished. No distress.  Nontoxic appearing; pleasant.  HENT:  Head: Normocephalic and atraumatic.  Eyes: Conjunctivae and EOM are normal. No scleral icterus.  Neck: Normal range of motion.  Cardiovascular: Normal rate, regular rhythm and intact distal pulses.  Distal radial pulse 2+ in the LUE  Pulmonary/Chest: Effort normal. No respiratory distress.  Respirations even and unlabored  Musculoskeletal: Normal range of motion.  Normal ROM of the  left wrist. There is induration to the volar wrist associated with puncture site; erythema extending to the dorsal radial aspect of the L hand and up along the volar aspect of the L forearm. Lymphangitic streaking noted. No distinct fluctuance. No drainage. Compartments of the forearm proximal to the wrist are soft.  Neurological: He is alert and oriented to person, place, and time. He exhibits normal muscle tone. Coordination normal.  Skin: Skin is warm and dry. No rash noted. He is not diaphoretic. There is erythema. No pallor.  Psychiatric: He has a normal mood and affect. His behavior is normal.  Nursing note and vitals reviewed.      ED Treatments / Results  Labs (all labs ordered are listed, but only abnormal results are displayed) Labs Reviewed  C-REACTIVE PROTEIN - Abnormal; Notable for the following components:      Result Value   CRP 1.1 (*)    All other components within normal limits  CBC  I-STAT CG4 LACTIC ACID, ED    EKG None  Radiology No results found.  Procedures Procedures (including critical care time)  Medications Ordered in ED Medications  clindamycin (CLEOCIN) IVPB 600 mg (0 mg Intravenous Stopped 11/09/17 0125)     Initial Impression / Assessment and Plan / ED Course  I have reviewed the triage vital signs and the nursing notes.  Pertinent labs & imaging results that were available during my care of the patient were reviewed by me and considered in my medical decision making (see chart for details).     47 year old male presents to the emergency department for evaluation of redness and swelling to his left wrist extending to his forearm.  Physical exam findings concerning for cellulitis.  There is induration, but no distinct fluctuance.  No drainage.  No present concern for abscess or drainable fluid collection.  He has soft compartments.  No crepitus.  No concern for septic joint given full active and passive range of motion.  No pain with range of  motion.  He is afebrile.  Laboratory work-up reassuring with only mildly elevated CRP.  No leukocytosis or fever.  Lactate offers further reassurance.  The patient has received IV clindamycin.  Will discharge with course of same.  He has been instructed to follow-up with his primary care doctor in 48 hours for wound recheck.  Return precautions discussed and provided.  Patient discharged in stable condition with no unaddressed concerns.   Final Clinical Impressions(s) / ED Diagnoses   Final diagnoses:  Cellulitis of left upper extremity    ED Discharge Orders        Ordered    clindamycin (CLEOCIN) 150 MG capsule  3 times daily     11/09/17 0147    ibuprofen (ADVIL,MOTRIN) 400 MG tablet  Every 6 hours PRN     11/09/17 0147       Antony Madura, PA-C 11/09/17 0159    [place],  Mayer Masker, MD 11/09/17 [phone]

## 2017-11-09 NOTE — Discharge Instructions (Signed)
Take clindamycin as prescribed until finished.  We recommend that you take this antibiotic with an over-the-counter probiotic which can be purchased at your local pharmacy.  Take ibuprofen as needed for pain or discomfort.  Have your wound rechecked by your primary care doctor in 48 hours.  Return to the emergency department for new or concerning symptoms.

## 2018-04-21 ENCOUNTER — Other Ambulatory Visit: Payer: Self-pay | Admitting: Orthopedic Surgery

## 2018-04-21 DIAGNOSIS — M545 Low back pain, unspecified: Secondary | ICD-10-CM

## 2018-05-06 ENCOUNTER — Other Ambulatory Visit: Payer: BLUE CROSS/BLUE SHIELD

## 2018-07-04 MED FILL — TADALAFIL 5 MG TABS: 5 | 30 days supply | Qty: 30 | Fill #0

## 2018-08-03 MED FILL — TADALAFIL 5 MG TABS: 5 | 30 days supply | Qty: 30 | Fill #1

## 2018-09-08 MED FILL — TADALAFIL 5 MG TABS: 5 | 30 days supply | Qty: 30 | Fill #2

## 2018-10-21 MED FILL — TADALAFIL 5 MG TABS: 5 | 30 days supply | Qty: 30 | Fill #3

## 2018-11-03 MED FILL — AMLODIPINE-BENAZEPRIL 5-20: 5-20 | 30 days supply | Qty: 60 | Fill #0

## 2018-11-03 MED FILL — BISOPROLOL FUMARATE 5 MG TA: 5 | 30 days supply | Qty: 30 | Fill #0

## 2018-11-03 MED FILL — buPROPion HCL ER (XL) 300 M: 300 | 30 days supply | Qty: 30 | Fill #0

## 2018-11-10 MED FILL — ESCITALOPRAM 10 MG TABLET: 10 | 30 days supply | Qty: 30 | Fill #0

## 2018-11-12 ENCOUNTER — Other Ambulatory Visit: Payer: Self-pay

## 2018-11-12 DIAGNOSIS — Z20822 Contact with and (suspected) exposure to covid-19: Secondary | ICD-10-CM

## 2018-11-13 LAB — NOVEL CORONAVIRUS, NAA: SARS-CoV-2, NAA: NOT DETECTED

## 2018-11-25 MED FILL — TADALAFIL 5 MG TABS: 5 | 30 days supply | Qty: 30 | Fill #4

## 2018-12-01 MED FILL — TESTOSTERONE CYP 200 MG/ML: 200 | 84 days supply | Qty: 6 | Fill #0

## 2018-12-05 ENCOUNTER — Ambulatory Visit (INDEPENDENT_AMBULATORY_CARE_PROVIDER_SITE_OTHER): Payer: BC Managed Care – PPO

## 2018-12-05 ENCOUNTER — Ambulatory Visit: Payer: BC Managed Care – PPO | Admitting: Podiatry

## 2018-12-05 ENCOUNTER — Other Ambulatory Visit: Payer: Self-pay | Admitting: Podiatry

## 2018-12-05 ENCOUNTER — Other Ambulatory Visit: Payer: Self-pay

## 2018-12-05 ENCOUNTER — Encounter: Payer: Self-pay | Admitting: Podiatry

## 2018-12-05 VITALS — BP 154/102 | HR 84 | Temp 98.4°F

## 2018-12-05 DIAGNOSIS — M7751 Other enthesopathy of right foot: Secondary | ICD-10-CM

## 2018-12-05 DIAGNOSIS — Q6650 Congenital pes planus, unspecified foot: Secondary | ICD-10-CM

## 2018-12-05 DIAGNOSIS — M7742 Metatarsalgia, left foot: Secondary | ICD-10-CM

## 2018-12-05 DIAGNOSIS — M7752 Other enthesopathy of left foot: Secondary | ICD-10-CM

## 2018-12-05 DIAGNOSIS — M79671 Pain in right foot: Secondary | ICD-10-CM

## 2018-12-05 DIAGNOSIS — M7741 Metatarsalgia, right foot: Secondary | ICD-10-CM

## 2018-12-05 MED ORDER — DICLOFENAC SODIUM 75 MG PO TBEC: 75.0000 mg | DELAYED_RELEASE_TABLET | Freq: Two times a day (BID) | ORAL | 1 refills | Status: DC

## 2018-12-05 MED ORDER — METHYLPREDNISOLONE 4 MG PO TBPK: ORAL_TABLET | ORAL | 0 refills | Status: DC

## 2018-12-05 MED FILL — METHYLPREDNISOLONE 4 MG TAB: 4 | 6 days supply | Qty: 21 | Fill #0

## 2018-12-05 MED FILL — DICLOFENAC SODIUM 75 MG TAB: 75 | 30 days supply | Qty: 60 | Fill #0

## 2018-12-05 MED FILL — BISOPROLOL FUMARATE 5 MG TA: 5 | 30 days supply | Qty: 30 | Fill #1

## 2018-12-07 NOTE — Progress Notes (Signed)
   Subjective:  48 y.o. male presenting today as a new patient with a chief complaint of bilateral foot pain that began about two months ago. He states he wears steel-toed boots daily for work and believes that is what is causing his pain. He has been treated by another physician who recommended surgery but he would like another opinion. Patient is here for further evaluation and treatment.   Past Medical History:  Diagnosis Date  . Bipolar disorder, unspecified ([place])   . Hypertension        Objective/Physical Exam General: The patient is alert and oriented x3 in no acute distress.  Dermatology: Skin is warm, dry and supple bilateral lower extremities. Negative for open lesions or macerations.  Vascular: Palpable pedal pulses bilaterally. No edema or erythema noted. Capillary refill within normal limits.  Neurological: Epicritic and protective threshold grossly intact bilaterally.   Musculoskeletal Exam: Range of motion within normal limits to all pedal and ankle joints bilateral. Muscle strength 5/5 in all groups bilateral.  Upon weightbearing there is a medial longitudinal arch collapse bilaterally. Remove foot valgus noted to the bilateral lower extremities with excessive pronation upon mid stance. Pain with palpation noted to the metatarsal heads of the bilateral feet.  Limited ROM with ankle joint dorsiflexion. Positive Silfverskiold test.   Pain with palpation noted to the 1st MPJ of the bilateral feet.   Radiographic Exam:  Normal osseous mineralization. Joint spaces preserved. No fracture/dislocation/boney destruction.   Pes planus noted on radiographic exam lateral views. Decreased calcaneal inclination and metatarsal declination angle is noted. Anterior break in the cyma line noted on lateral views. Medial talar head to deviation noted on AP radiograph.   Assessment: 1. pes planus bilateral 2. Metatarsalgia bilateral 3. Equinus bilateral  4. Capsulitis 1st MPJ bilateral     Plan of Care:  1. Patient was evaluated. X-Rays reviewed.  2. Appointment with Liliane Channel, Pedorthist, for custom molded orthotics.  3. Prescription for Medrol Dose Pak provided to patient. 4. Prescription for Diclofenac provided to patient. 5. Return to clinic in 6 weeks.   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    Edrick Kins, DPM [facility]  Dr. Edrick Kins, [address]                                        [place], [place] [address]                Office [phone]  Fax [phone]

## 2018-12-08 MED FILL — AMLODIPINE-BENAZEPRIL 5-20: 5-20 | 30 days supply | Qty: 60 | Fill #0

## 2018-12-12 ENCOUNTER — Telehealth: Payer: Self-pay | Admitting: *Deleted

## 2018-12-12 NOTE — Telephone Encounter (Signed)
[facility] states they received medical encounters from our office on this pt and his is not a pt with there office and Dr. Karie Chimera is not one of their physicians.

## 2018-12-12 NOTE — Telephone Encounter (Signed)
I reviewed our clinicals and asked the records coordinator if she sent the records and she denied. I called [facility] and she stated the records had been faxed by Thereasa Distance, [place].

## 2018-12-16 MED FILL — buPROPion HCL ER (XL) 300 M: 300 | 30 days supply | Qty: 30 | Fill #0

## 2018-12-20 ENCOUNTER — Inpatient Hospital Stay: Admission: RE | Admit: 2018-12-20 | Payer: Self-pay | Source: Ambulatory Visit

## 2018-12-21 NOTE — Telephone Encounter (Signed)
Called and spoke to patient to get correct PCP Dr. Felipa Furnace of [facility] but not sure what paperwork is needed to be faxed. Says he's only seeing Dr Amalia Hailey for Orthotics. Confirmed appt with Rick on 12/26/2018 at 3:00pm.

## 2018-12-22 ENCOUNTER — Ambulatory Visit: Admission: RE | Admit: 2018-12-22 | Payer: BC Managed Care – PPO | Source: Home / Self Care | Admitting: Podiatry

## 2018-12-22 ENCOUNTER — Encounter: Admission: RE | Payer: Self-pay | Source: Home / Self Care

## 2018-12-22 SURGERY — BUNIONECTOMY, AKIN
Anesthesia: Choice | Laterality: Left

## 2018-12-26 ENCOUNTER — Ambulatory Visit: Payer: BC Managed Care – PPO | Admitting: Orthotics

## 2018-12-26 ENCOUNTER — Other Ambulatory Visit: Payer: Self-pay

## 2018-12-26 DIAGNOSIS — Q6650 Congenital pes planus, unspecified foot: Secondary | ICD-10-CM

## 2018-12-26 NOTE — Progress Notes (Signed)
Patient came in today to pick up custom made foot orthotics.  The goals were accomplished and the patient reported no dissatisfaction with said orthotics.  Patient was advised of breakin period and how to report any issues. 

## 2019-01-04 MED FILL — BISOPROLOL FUMARATE 5 MG TA: 5 | 30 days supply | Qty: 30 | Fill #0

## 2019-01-04 MED FILL — TADALAFIL 5 MG TABS: 5 | 30 days supply | Qty: 30 | Fill #0

## 2019-01-04 MED FILL — AMLODIPINE-BENAZEPRIL 5-20: 5-20 | 30 days supply | Qty: 60 | Fill #1

## 2019-01-04 MED FILL — DICLOFENAC SODIUM 75 MG TAB: 75 | 30 days supply | Qty: 60 | Fill #1

## 2019-01-09 ENCOUNTER — Telehealth (HOSPITAL_COMMUNITY): Payer: Self-pay | Admitting: Professional

## 2019-01-16 ENCOUNTER — Other Ambulatory Visit: Payer: Self-pay

## 2019-01-16 ENCOUNTER — Ambulatory Visit (INDEPENDENT_AMBULATORY_CARE_PROVIDER_SITE_OTHER): Payer: BC Managed Care – PPO | Admitting: Podiatry

## 2019-01-16 DIAGNOSIS — Q6650 Congenital pes planus, unspecified foot: Secondary | ICD-10-CM

## 2019-01-16 DIAGNOSIS — M7751 Other enthesopathy of right foot: Secondary | ICD-10-CM

## 2019-01-16 DIAGNOSIS — M7752 Other enthesopathy of left foot: Secondary | ICD-10-CM | POA: Diagnosis not present

## 2019-01-16 DIAGNOSIS — M7742 Metatarsalgia, left foot: Secondary | ICD-10-CM

## 2019-01-16 DIAGNOSIS — M7741 Metatarsalgia, right foot: Secondary | ICD-10-CM | POA: Diagnosis not present

## 2019-01-16 MED FILL — buPROPion HCL ER (XL) 300 M: 300 | 30 days supply | Qty: 30 | Fill #0

## 2019-01-19 NOTE — Progress Notes (Signed)
   Subjective:  48 y.o. male presenting today for follow up evaluation of bilateral foot pain. He states he is doing well and has improved significantly since his last appointment. He reports taking the Medrol Dose Pak and Diclofenac as directed which have helped alleviate the symptoms. He also notes relief with orthotics use. He denies any worsening factors at this time. Patient is here for further evaluation and treatment.   Past Medical History:  Diagnosis Date  . Bipolar disorder, unspecified ([place])   . Hypertension        Objective/Physical Exam General: The patient is alert and oriented x3 in no acute distress.  Dermatology: Skin is warm, dry and supple bilateral lower extremities. Negative for open lesions or macerations.  Vascular: Palpable pedal pulses bilaterally. No edema or erythema noted. Capillary refill within normal limits.  Neurological: Epicritic and protective threshold grossly intact bilaterally.   Musculoskeletal Exam: Range of motion within normal limits to all pedal and ankle joints bilateral. Muscle strength 5/5 in all groups bilateral.  Upon weightbearing there is a medial longitudinal arch collapse bilaterally. Remove foot valgus noted to the bilateral lower extremities with excessive pronation upon mid stance. Pain with palpation noted to the metatarsal heads of the bilateral feet.  Limited ROM with ankle joint dorsiflexion. Positive Silfverskiold test.   Pain with palpation noted to the 1st MPJ of the bilateral feet.   Assessment: 1. pes planus bilateral 2. Metatarsalgia bilateral 3. Equinus bilateral  4. Capsulitis 1st MPJ bilateral    Plan of Care:  1. Patient was evaluated. 2. Continue taking Diclofenac as needed.  3. Order placed for 2nd pair of custom orthotics.  4. Return to clinic as needed.   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    Edrick Kins, DPM [facility]  Dr. Edrick Kins, [address]                                         [place], [place] [address]                Office [phone]  Fax [phone]

## 2019-01-20 ENCOUNTER — Encounter: Payer: Self-pay | Admitting: Podiatry

## 2019-01-24 ENCOUNTER — Telehealth: Payer: Self-pay | Admitting: Podiatry

## 2019-01-24 NOTE — Telephone Encounter (Signed)
Pt sent message on 10.9.2020 about medication upsetting stomach and has not heard anything back. Mentioned maybe getting a different pain medication...please advise

## 2019-01-25 NOTE — Telephone Encounter (Signed)
Patient taking diclofenac. Discontinue diclofenac. Cannot take NSAIDs any more due to stomach upset. Recommend OTC extra strength tylenol. I do not want to prescribe any opiod pain killers at the moment.  - Dr. Amalia Hailey

## 2019-01-25 NOTE — Telephone Encounter (Signed)
I informed pt of Dr. Evans orders. 

## 2019-02-02 MED FILL — BISOPROLOL FUMARATE 5 MG TA: 5 | 30 days supply | Qty: 30 | Fill #1

## 2019-02-09 ENCOUNTER — Telehealth (HOSPITAL_COMMUNITY): Payer: Self-pay | Admitting: Psychiatry

## 2019-02-09 NOTE — Telephone Encounter (Signed)
D:  Nita Sells (CMA) referred pt to [facility]-IOP.  A:  Placed call to orient and provide pt with a start date, but there was no answer.  Left vm for pt to call writer if interested.  Inform Jessica.

## 2019-02-10 MED FILL — AMLODIPINE-BENAZEPRIL 5-20: 5-20 | 90 days supply | Qty: 180 | Fill #0

## 2019-02-10 MED FILL — buPROPion HCL ER (XL) 300 M: 300 | 90 days supply | Qty: 90 | Fill #0

## 2019-03-08 MED FILL — BISOPROLOL FUMARATE 5 MG TA: 5 | 90 days supply | Qty: 90 | Fill #0

## 2019-03-08 MED FILL — TESTOSTERONE CYP 200 MG/ML: 200 | 84 days supply | Qty: 12 | Fill #0

## 2019-03-24 MED FILL — TADALAFIL 5 MG TABS: 5 | 30 days supply | Qty: 30 | Fill #0

## 2019-05-05 MED FILL — TADALAFIL 5 MG TABS: 5 | 30 days supply | Qty: 30 | Fill #1

## 2019-05-18 MED FILL — AMLODIPINE-BENAZEPRIL 5-20: 5-20 | 90 days supply | Qty: 180 | Fill #1

## 2019-05-18 MED FILL — BUPROPION HCL ER (XL) 300 M: 300 | 90 days supply | Qty: 90 | Fill #1

## 2019-06-06 MED FILL — BISOPROLOL FUMARATE 5 MG TA: 5 | 90 days supply | Qty: 90 | Fill #1

## 2019-06-06 MED FILL — TESTOSTERONE CYP 200 MG/ML: 200 | 28 days supply | Qty: 4 | Fill #0

## 2019-06-16 MED FILL — TADALAFIL 5 MG TABS: 5 | 30 days supply | Qty: 30 | Fill #2

## 2019-06-27 ENCOUNTER — Encounter: Payer: Self-pay | Admitting: Podiatry

## 2019-06-28 ENCOUNTER — Telehealth: Payer: Self-pay | Admitting: *Deleted

## 2019-06-28 NOTE — Telephone Encounter (Signed)
Patient is ready to schedule for surgery. Please call.

## 2019-07-04 ENCOUNTER — Other Ambulatory Visit: Payer: Self-pay | Admitting: Podiatry

## 2019-07-04 ENCOUNTER — Ambulatory Visit (INDEPENDENT_AMBULATORY_CARE_PROVIDER_SITE_OTHER): Payer: BC Managed Care – PPO

## 2019-07-04 ENCOUNTER — Other Ambulatory Visit: Payer: Self-pay

## 2019-07-04 ENCOUNTER — Ambulatory Visit: Payer: BC Managed Care – PPO | Admitting: Podiatry

## 2019-07-04 ENCOUNTER — Encounter: Payer: Self-pay | Admitting: Podiatry

## 2019-07-04 VITALS — Temp 98.0°F

## 2019-07-04 DIAGNOSIS — M7741 Metatarsalgia, right foot: Secondary | ICD-10-CM | POA: Diagnosis not present

## 2019-07-04 DIAGNOSIS — M7742 Metatarsalgia, left foot: Secondary | ICD-10-CM

## 2019-07-04 DIAGNOSIS — M21611 Bunion of right foot: Secondary | ICD-10-CM

## 2019-07-04 DIAGNOSIS — M2011 Hallux valgus (acquired), right foot: Secondary | ICD-10-CM

## 2019-07-04 DIAGNOSIS — M21612 Bunion of left foot: Secondary | ICD-10-CM

## 2019-07-04 DIAGNOSIS — Q6650 Congenital pes planus, unspecified foot: Secondary | ICD-10-CM | POA: Diagnosis not present

## 2019-07-04 DIAGNOSIS — M7751 Other enthesopathy of right foot: Secondary | ICD-10-CM

## 2019-07-04 DIAGNOSIS — M7752 Other enthesopathy of left foot: Secondary | ICD-10-CM

## 2019-07-04 NOTE — Patient Instructions (Signed)
Pre-Operative Instructions  Congratulations, you have decided to take an important step towards improving your quality of life.  You can be assured that the doctors and staff at [facility] will be with you every step of the way.  Here are some important things you should know:  1. Plan to be at the surgery center/hospital at least 1 (one) hour prior to your scheduled time, unless otherwise directed by the surgical center/hospital staff.  You must have a responsible adult accompany you, remain during the surgery and drive you home.  Make sure you have directions to the surgical center/hospital to ensure you arrive on time. 2. If you are having surgery at [facility] or [facility] hospitals, you will need a copy of your medical history and physical form from your family physician within one month prior to the date of surgery. We will give you a form for your primary physician to complete.  3. We make every effort to accommodate the date you request for surgery.  However, there are times where surgery dates or times have to be moved.  We will contact you as soon as possible if a change in schedule is required.   4. No aspirin/ibuprofen for one week before surgery.  If you are on aspirin, any non-steroidal anti-inflammatory medications (Mobic, Aleve, Ibuprofen) should not be taken seven (7) days prior to your surgery.  You make take Tylenol for pain prior to surgery.  5. Medications - If you are taking daily heart and blood pressure medications, seizure, reflux, allergy, asthma, anxiety, pain or diabetes medications, make sure you notify the surgery center/hospital before the day of surgery so they can tell you which medications you should take or avoid the day of surgery. 6. No food or drink after midnight the night before surgery unless directed otherwise by surgical center/hospital staff. 7. No alcoholic beverages 24-hours prior to surgery.  No smoking 24-hours prior or 24-hours after  surgery. 8. Wear loose pants or shorts. They should be loose enough to fit over bandages, boots, and casts. 9. Don't wear slip-on shoes. Sneakers are preferred. 10. Bring your boot with you to the surgery center/hospital.  Also bring crutches or a walker if your physician has prescribed it for you.  If you do not have this equipment, it will be provided for you after surgery. 11. If you have not been contacted by the surgery center/hospital by the day before your surgery, call to confirm the date and time of your surgery. 12. Leave-time from work may vary depending on the type of surgery you have.  Appropriate arrangements should be made prior to surgery with your employer. 13. Prescriptions will be provided immediately following surgery by your doctor.  Fill these as soon as possible after surgery and take the medication as directed. Pain medications will not be refilled on weekends and must be approved by the doctor. 14. Remove nail polish on the operative foot and avoid getting pedicures prior to surgery. 15. Wash the night before surgery.  The night before surgery wash the foot and leg well with water and the antibacterial soap provided. Be sure to pay special attention to beneath the toenails and in between the toes.  Wash for at least three (3) minutes. Rinse thoroughly with water and dry well with a towel.  Perform this wash unless told not to do so by your physician.  Enclosed: 1 Ice pack (please put in freezer the night before surgery)   1 Hibiclens skin cleaner     Pre-op instructions  If you have any questions regarding the instructions, please do not hesitate to call our office.  [place]: [address], [place], [place] [address] -- [phone]  [place]: [address] [place], [place] [address] -- [phone]  [place]: [address], [place], [place] [address] -- [phone]   Website: [email or web address] 

## 2019-07-05 ENCOUNTER — Telehealth: Payer: Self-pay

## 2019-07-05 NOTE — Telephone Encounter (Signed)
DOS 08/17/19 & 09/21/19  08/17/19 - DOUBLE OSTEOTOMY LT - 28299 09/21/19 - DOUBLE OSTEOTOMY RT - 28299  [employer] EFFECTIVE DATE 04/14/19   Max Per Benefit Period Year-to-Date Remaining     CoInsurance 20%       Deductible $1000.00 $1000.00     Out-Of-Pocket 3 $8550.00 $8405.00    Copay Not Applicable Coinsurance 20% Authorization Required No

## 2019-07-06 NOTE — Progress Notes (Signed)
   Subjective:  49 y.o. male presenting today for follow up evaluation of bilateral foot pain. He states the pain has worsened over the past four months and is now a constant ache that makes it hard to walk throughout the day. He states the right is worse than the left but the pain is severe in both feet. He has been using custom orthotics for treatment with minimal relief. Patient is here for further evaluation and treatment.   Past Medical History:  Diagnosis Date  . Bipolar disorder, unspecified (HCC)   . Hypertension        Objective/Physical Exam General: The patient is alert and oriented x3 in no acute distress.  Dermatology: Skin is warm, dry and supple bilateral lower extremities. Negative for open lesions or macerations.  Vascular: Palpable pedal pulses bilaterally. No edema or erythema noted. Capillary refill within normal limits.  Neurological: Epicritic and protective threshold grossly intact bilaterally.   Musculoskeletal Exam: Range of motion within normal limits to all pedal and ankle joints bilateral. Muscle strength 5/5 in all groups bilateral.  Upon weightbearing there is a medial longitudinal arch collapse bilaterally. Remove foot valgus noted to the bilateral lower extremities with excessive pronation upon mid stance. Clinical evidence of bunion deformity noted to the respective foot. There is moderate pain on palpation range of motion of the first MPJ. Lateral deviation of the hallux noted consistent with hallux abductovalgus. Limited ROM with ankle joint dorsiflexion. Positive Silfverskiold test.    Assessment: 1. pes planus bilateral 2. HAV w/ bunion deformity bilateral  3. Equinus bilateral     Plan of Care:  1. Patient was evaluated.   2. Today we discussed the conservative versus surgical management of the presenting pathology. The patient opts for surgical management. All possible complications and details of the procedure were explained. All patient  questions were answered. No guarantees were expressed or implied. 3. Authorization for surgery was initiated today. Surgery will consist of bunionectomy with osteotomy left; 4 weeks later we will do bunionectomy with osteotomy right. Patient wants surgery 4 weeks apart.  4. Continue using custom orthotics.  5. Return to clinic one week post op.   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    Felecia Shelling, DPM [facility]  Dr. Felecia Shelling, DPM    [address]                                        [place], [place] [address]                Office [phone]  Fax [phone]

## 2019-07-07 MED FILL — TESTOSTERONE CYP 200 MG/ML: 200 | 28 days supply | Qty: 4 | Fill #1

## 2019-07-14 MED FILL — TADALAFIL 5 MG TABS: 5 | 30 days supply | Qty: 30 | Fill #3

## 2019-07-17 ENCOUNTER — Other Ambulatory Visit: Payer: Self-pay | Admitting: Podiatry

## 2019-07-17 ENCOUNTER — Telehealth: Payer: Self-pay | Admitting: *Deleted

## 2019-07-17 MED ORDER — METHYLPREDNISOLONE 4 MG PO TBPK: ORAL_TABLET | ORAL | 0 refills | Status: DC

## 2019-07-17 MED ORDER — MELOXICAM 15 MG PO TABS: 15.0000 mg | ORAL_TABLET | Freq: Every day | ORAL | 1 refills | Status: DC

## 2019-07-17 NOTE — Telephone Encounter (Signed)
Rx medrol and meloxicam sent to pharmacy. Hopefully this calms things down until his surgery. - Dr. Logan Bores

## 2019-07-17 NOTE — Telephone Encounter (Signed)
Pt states he is scheduled for surgery and is having more pain in the right foot and the pain medication prescribed last year is not helping.

## 2019-07-18 NOTE — Telephone Encounter (Signed)
I informed pt of Dr. Logan Bores orders and to take the medrol dose pack to completion and begin the meloxicam the following day. Pt states understanding.

## 2019-08-04 MED FILL — TESTOSTERONE CYP 200 MG/ML: 200 | 28 days supply | Qty: 4 | Fill #2

## 2019-08-17 ENCOUNTER — Telehealth: Payer: Self-pay | Admitting: *Deleted

## 2019-08-17 ENCOUNTER — Other Ambulatory Visit: Payer: Self-pay | Admitting: Podiatry

## 2019-08-17 DIAGNOSIS — M2022 Hallux rigidus, left foot: Secondary | ICD-10-CM

## 2019-08-17 MED ORDER — OXYCODONE-ACETAMINOPHEN 5-325 MG PO TABS: 1.0000 | ORAL_TABLET | Freq: Four times a day (QID) | ORAL | 0 refills | Status: DC | PRN

## 2019-08-17 MED FILL — BUPROPION HCL ER (XL) 300 M: 300 | 30 days supply | Qty: 30 | Fill #2

## 2019-08-17 MED FILL — TADALAFIL 5 MG TABS: 5 | 30 days supply | Qty: 30 | Fill #4

## 2019-08-17 NOTE — Progress Notes (Signed)
PRN postop 

## 2019-08-18 NOTE — Telephone Encounter (Signed)
Error

## 2019-08-23 ENCOUNTER — Ambulatory Visit (INDEPENDENT_AMBULATORY_CARE_PROVIDER_SITE_OTHER): Payer: BC Managed Care – PPO | Admitting: Podiatry

## 2019-08-23 ENCOUNTER — Other Ambulatory Visit: Payer: Self-pay

## 2019-08-23 ENCOUNTER — Ambulatory Visit (INDEPENDENT_AMBULATORY_CARE_PROVIDER_SITE_OTHER): Payer: BC Managed Care – PPO

## 2019-08-23 DIAGNOSIS — Z9889 Other specified postprocedural states: Secondary | ICD-10-CM

## 2019-08-23 DIAGNOSIS — M21611 Bunion of right foot: Secondary | ICD-10-CM

## 2019-08-23 DIAGNOSIS — M21612 Bunion of left foot: Secondary | ICD-10-CM

## 2019-08-23 MED ORDER — DOXYCYCLINE HYCLATE 100 MG PO TABS: 100.0000 mg | ORAL_TABLET | Freq: Two times a day (BID) | ORAL | 0 refills | Status: DC

## 2019-08-23 MED ORDER — OXYCODONE-ACETAMINOPHEN 5-325 MG PO TABS: 1.0000 | ORAL_TABLET | Freq: Four times a day (QID) | ORAL | 0 refills | Status: DC | PRN

## 2019-08-27 NOTE — Progress Notes (Signed)
   Subjective:  Patient presents today status post bunionectomy left. DOS: 08/17/2019. He reports severe pain during the three days immediately following surgery that has since started to subside. He was taking Percocet for treatment with no significant relief but states it is now helping to alleviate the symptoms. He has been icing and elevating the foot as well. There are no specific worsening factors noted. Patient is here for further evaluation and treatment.   Past Medical History:  Diagnosis Date  . Bipolar disorder, unspecified (HCC)   . Hypertension       Objective/Physical Exam Neurovascular status intact.  Skin incisions appear to be well coapted with sutures and staples intact. No sign of infectious process noted. No dehiscence. No active bleeding noted. Moderate edema noted to the surgical extremity.  Radiographic Exam:  Orthopedic hardware and osteotomies sites appear to be stable with routine healing.  Assessment: 1. s/p bunionectomy left. DOS: 08/17/2019   Plan of Care:  1. Patient was evaluated. X-rays reviewed 2. Dressing changed. Keep clean, dry and intact for one week.  3. Prescription for Doxycycline 100 mg #20 provided to patient.  4. Refill prescription for Percocet 5/325 mg provided to patient.  5. Return to clinic in one week.    Felecia Shelling, DPM [facility]  Dr. Felecia Shelling, DPM    [address]                                        [place], [place] [address]                Office [phone]  Fax [phone]

## 2019-08-30 ENCOUNTER — Other Ambulatory Visit: Payer: Self-pay

## 2019-08-30 ENCOUNTER — Ambulatory Visit (INDEPENDENT_AMBULATORY_CARE_PROVIDER_SITE_OTHER): Payer: BC Managed Care – PPO | Admitting: Podiatry

## 2019-08-30 DIAGNOSIS — M21612 Bunion of left foot: Secondary | ICD-10-CM

## 2019-08-30 DIAGNOSIS — M21611 Bunion of right foot: Secondary | ICD-10-CM

## 2019-08-30 DIAGNOSIS — Z9889 Other specified postprocedural states: Secondary | ICD-10-CM

## 2019-08-30 MED ORDER — OXYCODONE-ACETAMINOPHEN 5-325 MG PO TABS: 1.0000 | ORAL_TABLET | Freq: Four times a day (QID) | ORAL | 0 refills | Status: DC | PRN

## 2019-09-02 NOTE — Progress Notes (Signed)
   Subjective:  Patient presents today status post bunionectomy left. DOS: 08/17/2019. He states he is doing well and improving. He reports continued intermittent moderate pain but states it has been tolerable with the pain medication. He has been using the CAM boot as directed. There are no worsening factors noted. Patient is here for further evaluation and treatment.   Past Medical History:  Diagnosis Date  . Bipolar disorder, unspecified (HCC)   . Hypertension       Objective/Physical Exam Neurovascular status intact.  Skin incisions appear to be well coapted with sutures and staples intact. No sign of infectious process noted. No dehiscence. No active bleeding noted. Moderate edema noted to the surgical extremity.  Assessment: 1. s/p bunionectomy left. DOS: 08/17/2019   Plan of Care:  1. Patient was evaluated.  2. Staples removed.  3. Continue using CAM boot.  4. Refill prescription for Percocet 5/325 mg provided to patient.  5. Return to clinic in 2 weeks for follow up X-Ray.    Felecia Shelling, DPM [facility]  Dr. Felecia Shelling, DPM    [address]                                        [place], [place] [address]                Office [phone]  Fax [phone]

## 2019-09-04 ENCOUNTER — Encounter: Payer: Self-pay | Admitting: Podiatry

## 2019-09-04 MED FILL — TESTOSTERONE CYP 200 MG/ML: 200 | 28 days supply | Qty: 4 | Fill #3

## 2019-09-04 MED FILL — AMLODIPINE-BENAZEPRIL 5-20: 5-20 | 30 days supply | Qty: 60 | Fill #2

## 2019-09-04 MED FILL — BISOPROLOL FUMARATE 5 MG TA: 5 | 30 days supply | Qty: 30 | Fill #2

## 2019-09-05 ENCOUNTER — Other Ambulatory Visit: Payer: Self-pay | Admitting: Podiatry

## 2019-09-05 MED ORDER — OXYCODONE-ACETAMINOPHEN 5-325 MG PO TABS: 1.0000 | ORAL_TABLET | Freq: Four times a day (QID) | ORAL | 0 refills | Status: DC | PRN

## 2019-09-05 MED FILL — OXYCODONE-ACETAMINOPHEN 5-3: 5-325 | 8 days supply | Qty: 30 | Fill #0

## 2019-09-05 NOTE — Telephone Encounter (Signed)
Rx sent 

## 2019-09-09 ENCOUNTER — Other Ambulatory Visit: Payer: Self-pay | Admitting: Podiatry

## 2019-09-13 ENCOUNTER — Ambulatory Visit (INDEPENDENT_AMBULATORY_CARE_PROVIDER_SITE_OTHER): Payer: BC Managed Care – PPO

## 2019-09-13 ENCOUNTER — Ambulatory Visit (INDEPENDENT_AMBULATORY_CARE_PROVIDER_SITE_OTHER): Payer: BC Managed Care – PPO | Admitting: Podiatry

## 2019-09-13 ENCOUNTER — Other Ambulatory Visit: Payer: Self-pay

## 2019-09-13 DIAGNOSIS — M21612 Bunion of left foot: Secondary | ICD-10-CM | POA: Diagnosis not present

## 2019-09-13 DIAGNOSIS — Z9889 Other specified postprocedural states: Secondary | ICD-10-CM

## 2019-09-13 DIAGNOSIS — M21611 Bunion of right foot: Secondary | ICD-10-CM

## 2019-09-13 NOTE — Progress Notes (Signed)
   Subjective:  Patient presents today status post bunionectomy left. DOS: 08/17/2019.  He states that he is doing very well.  He has not needed pain medication in the past week.  He denies fever nausea chills vomiting.  He is currently working from home.  No new complaints at this time  Past Medical History:  Diagnosis Date  . Bipolar disorder, unspecified (HCC)   . Hypertension       Objective/Physical Exam Neurovascular status intact.  Skin incisions appear to be well coapted. No sign of infectious process noted. No dehiscence. No active bleeding noted.  Negative for any edema to the surgical extremity.  Clinical evidence of a bunion deformity noted to the right foot.  Assessment: 1. s/p bunionectomy left. DOS: 08/17/2019 2.  Bunion deformity right foot  Plan of Care:  1. Patient was evaluated.  2.  Discontinue cam boot left foot.  Recommend good supportive tennis shoes 3.  Compression anklet dispensed wear daily 4.  Patient scheduled for right bunion surgery on 09/21/2019.  Surgery will consist of bunionectomy with osteotomy right foot. 5.  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   Felecia Shelling, DPM [facility]  Dr. Felecia Shelling, DPM    [address]                                        [place], [place] [address]                Office [phone]  Fax [phone]

## 2019-09-15 MED FILL — TADALAFIL 5 MG TABS: 5 | 30 days supply | Qty: 30 | Fill #5

## 2019-09-15 MED FILL — BUPROPION HCL ER (XL) 300 M: 300 | 30 days supply | Qty: 30 | Fill #3

## 2019-09-21 ENCOUNTER — Other Ambulatory Visit: Payer: Self-pay | Admitting: Podiatry

## 2019-09-21 DIAGNOSIS — M2011 Hallux valgus (acquired), right foot: Secondary | ICD-10-CM

## 2019-09-21 MED ORDER — OXYCODONE-ACETAMINOPHEN 5-325 MG PO TABS: 1.0000 | ORAL_TABLET | Freq: Four times a day (QID) | ORAL | 0 refills | Status: DC | PRN

## 2019-09-21 NOTE — Progress Notes (Signed)
PRN postop 

## 2019-09-22 ENCOUNTER — Telehealth: Payer: Self-pay | Admitting: Podiatry

## 2019-09-22 ENCOUNTER — Encounter: Payer: Self-pay | Admitting: Podiatry

## 2019-09-22 NOTE — Telephone Encounter (Signed)
I spoke with pt and he had sharp and throbbing pain. I instructed pt to remove the boot, open-ended sock, and ace wrap, elevate the foot for 15 minutes if pain worsened dangle the foot for 15 minutes, after 15 minutes either way place the foot level with the hip and redress with ace and boot.

## 2019-09-22 NOTE — Telephone Encounter (Signed)
Pt called and stated he had dried up blood on bandages no bleeding pt did state that pain is way more intense wants something a little stronger for pain stated that the pain is at 9.5 also wanted to know about changing bandages did take ibupropen to see if it would help ease pain and it did not please advise

## 2019-09-27 ENCOUNTER — Other Ambulatory Visit: Payer: Self-pay

## 2019-09-27 ENCOUNTER — Ambulatory Visit (INDEPENDENT_AMBULATORY_CARE_PROVIDER_SITE_OTHER): Payer: BC Managed Care – PPO | Admitting: Podiatry

## 2019-09-27 ENCOUNTER — Ambulatory Visit (INDEPENDENT_AMBULATORY_CARE_PROVIDER_SITE_OTHER): Payer: BC Managed Care – PPO

## 2019-09-27 DIAGNOSIS — M21611 Bunion of right foot: Secondary | ICD-10-CM

## 2019-09-27 DIAGNOSIS — Z9889 Other specified postprocedural states: Secondary | ICD-10-CM

## 2019-09-27 DIAGNOSIS — M7751 Other enthesopathy of right foot: Secondary | ICD-10-CM

## 2019-09-27 DIAGNOSIS — M21612 Bunion of left foot: Secondary | ICD-10-CM

## 2019-09-27 DIAGNOSIS — M7752 Other enthesopathy of left foot: Secondary | ICD-10-CM

## 2019-09-28 ENCOUNTER — Encounter: Payer: Self-pay | Admitting: Podiatry

## 2019-09-28 NOTE — Progress Notes (Signed)
Subjective:  Patient ID: Devon Hayden, male    DOB: Jan 29, 1971,  MRN: [id number]  Chief Complaint  Patient presents with  . Routine Post Op    POV #1 DOS 09/21/19 DOUBLE OSTEOTOMY RT/DR EVANS PT     49 y.o. male returns for post-op check.  Patient states that she is doing well.  She had surgery done by Dr. Logan Bores as listed above.  She still has some pain.  Patient has been taking only ibuprofen.  Cam boot is helping.  She says been she is keeping it nice dry and intact.  Staples are intact.  Pain scale 3 out of 10.  She denies any other acute complaints  Review of Systems: Negative except as noted in the HPI. Denies N/V/F/Ch.  Past Medical History:  Diagnosis Date  . Bipolar disorder, unspecified (HCC)   . Hypertension     Current Outpatient Medications:  .  amLODipine (NORVASC) 10 MG tablet, Take 1 tablet (10 mg total) by mouth daily., Disp: 90 tablet, Rfl: 0 .  buPROPion (WELLBUTRIN XL) 300 MG 24 hr tablet, Take 300 mg by mouth daily., Disp: , Rfl:  .  diclofenac (VOLTAREN) 75 MG EC tablet, Take 1 tablet (75 mg total) by mouth 2 (two) times daily., Disp: 60 tablet, Rfl: 1 .  hydrochlorothiazide (HYDRODIURIL) 12.5 MG tablet, Take by mouth., Disp: , Rfl:  .  ibuprofen (ADVIL,MOTRIN) 400 MG tablet, Take 1 tablet (400 mg total) by mouth every 6 (six) hours as needed for mild pain or moderate pain., Disp: 30 tablet, Rfl: 0 .  losartan (COZAAR) 100 MG tablet, Take 1 tablet (100 mg total) by mouth daily., Disp: 90 tablet, Rfl: 0 .  losartan-hydrochlorothiazide (HYZAAR) 100-12.5 MG tablet, Take 1 tablet by mouth daily., Disp: , Rfl: 0 .  meloxicam (MOBIC) 15 MG tablet, TAKE 1 TABLET BY MOUTH EVERY DAY, Disp: 30 tablet, Rfl: 1 .  oxyCODONE-acetaminophen (PERCOCET/ROXICET) 5-325 MG tablet, Take 1 tablet by mouth every 6 (six) hours as needed for severe pain., Disp: 30 tablet, Rfl: 0 .  tadalafil (CIALIS) 5 MG tablet, Take by mouth., Disp: , Rfl:  .  testosterone cypionate  (DEPO-TESTOSTERONE) 200 MG/ML injection, Inject 200 mg into the muscle every Saturday., Disp: , Rfl:  .  amLODipine-benazepril (LOTREL) 5-20 MG capsule, Take by mouth., Disp: , Rfl:  .  bisoprolol (ZEBETA) 5 MG tablet, Take by mouth., Disp: , Rfl:  .  buPROPion (WELLBUTRIN XL) 150 MG 24 hr tablet, Take 150 mg by mouth every morning., Disp: , Rfl:   Social History   Tobacco Use  Smoking Status Former Smoker  Smokeless Tobacco Never Used    No Known Allergies Objective:  There were no vitals filed for this visit. There is no height or weight on file to calculate BMI. Constitutional Well developed. Well nourished.  Vascular Foot warm and well perfused. Capillary refill normal to all digits.   Neurologic Normal speech. Oriented to person, place, and time. Epicritic sensation to light touch grossly present bilaterally.  Dermatologic Skin healing well without signs of infection. Skin edges well coapted without signs of infection.  Orthopedic: Tenderness to palpation noted about the surgical site.   Radiographs: 3 views of skeletally mature adult foot: Hardware is intact.  Good correction alignment noted.  No signs of breaking or dehiscence noted.  No other bony abnormalities identified. Assessment:   1. Capsulitis of metatarsophalangeal (MTP) joints of both feet   2. Bilateral bunions   3. Status post right foot surgery  Plan:  Patient was evaluated and treated and all questions answered.  S/p foot surgery right -Progressing as expected post-operatively. -XR: See above -WB Status: Weightbearing as tolerated in cam boot -Sutures/staples: Intact without any signs of dehiscence.  No clinical signs of infection noted.. -Medications: None -Foot redressed.  No follow-ups on file.

## 2019-10-03 MED FILL — BISOPROLOL FUMARATE 5 MG TA: 5 | 30 days supply | Qty: 30 | Fill #3

## 2019-10-03 MED FILL — AMLODIPINE-BENAZEPRIL 5-20: 5-20 | 30 days supply | Qty: 60 | Fill #3

## 2019-10-03 MED FILL — TESTOSTERONE CYP 200 MG/ML: 200 | 28 days supply | Qty: 4 | Fill #4

## 2019-10-04 ENCOUNTER — Ambulatory Visit (INDEPENDENT_AMBULATORY_CARE_PROVIDER_SITE_OTHER): Payer: BC Managed Care – PPO | Admitting: Podiatry

## 2019-10-04 ENCOUNTER — Other Ambulatory Visit: Payer: Self-pay

## 2019-10-04 ENCOUNTER — Encounter: Payer: Self-pay | Admitting: Podiatry

## 2019-10-04 VITALS — Temp 96.6°F

## 2019-10-04 DIAGNOSIS — M21612 Bunion of left foot: Secondary | ICD-10-CM

## 2019-10-04 DIAGNOSIS — M7751 Other enthesopathy of right foot: Secondary | ICD-10-CM

## 2019-10-04 DIAGNOSIS — M7752 Other enthesopathy of left foot: Secondary | ICD-10-CM

## 2019-10-04 DIAGNOSIS — M21611 Bunion of right foot: Secondary | ICD-10-CM

## 2019-10-04 DIAGNOSIS — Z9889 Other specified postprocedural states: Secondary | ICD-10-CM

## 2019-10-04 NOTE — Progress Notes (Signed)
   Subjective:  Patient presents today status post bilateral bunionectomy foot surgery. Left foot surgery DOS: 08/17/2019. Right foot surgery 09/21/2019. Patient states that he is doing very well. He has been wearing the cam boot to the right foot and good supportive sneakers to the left foot. Patient states he has minimal pain. Tolerable with OTC medication. He has been working from home. No new complaints at this time  Past Medical History:  Diagnosis Date  . Bipolar disorder, unspecified (HCC)   . Hypertension       Objective/Physical Exam Neurovascular status intact.  Skin incisions appear to be well coapted with staples intact. No sign of infectious process noted. No dehiscence. No active bleeding noted. Minimal edema noted to the surgical extremity.  Assessment: 1. s/p bunionectomy right foot. DOS: 09/21/2019 2. S/P bunionectomy left foot. DOS: 08/17/2019   Plan of Care:  1. Patient was evaluated.  2. Staples removed today right foot. 3. Ace wrap applied right foot. Continue weightbearing in the cam boot right foot x2 weeks 4. Continue good supportive sneakers left foot 5. Letter was provided for the patient today stating medical necessity for the bilateral foot surgery 6. 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   Felecia Shelling, DPM [facility]  Dr. Felecia Shelling, DPM    [address]                                        [place], [place] [address]                Office [phone]  Fax [phone]

## 2019-10-18 ENCOUNTER — Encounter: Payer: Self-pay | Admitting: Podiatry

## 2019-10-18 ENCOUNTER — Other Ambulatory Visit: Payer: Self-pay

## 2019-10-18 ENCOUNTER — Ambulatory Visit (INDEPENDENT_AMBULATORY_CARE_PROVIDER_SITE_OTHER): Payer: BC Managed Care – PPO | Admitting: Podiatry

## 2019-10-18 ENCOUNTER — Ambulatory Visit (INDEPENDENT_AMBULATORY_CARE_PROVIDER_SITE_OTHER): Payer: BC Managed Care – PPO

## 2019-10-18 DIAGNOSIS — M21612 Bunion of left foot: Secondary | ICD-10-CM

## 2019-10-18 DIAGNOSIS — M7752 Other enthesopathy of left foot: Secondary | ICD-10-CM | POA: Diagnosis not present

## 2019-10-18 DIAGNOSIS — M21611 Bunion of right foot: Secondary | ICD-10-CM

## 2019-10-18 DIAGNOSIS — M7751 Other enthesopathy of right foot: Secondary | ICD-10-CM | POA: Diagnosis not present

## 2019-10-18 DIAGNOSIS — Z9889 Other specified postprocedural states: Secondary | ICD-10-CM

## 2019-10-18 NOTE — Progress Notes (Signed)
   Subjective:  Patient presents today status post bilateral bunionectomy foot surgery. Left foot surgery DOS: 08/17/2019. Right foot surgery 09/21/2019.  Patient states that he is doing very well he has been weightbearing in the cam boot to his right lower extremity as instructed.  He is wearing good supportive sneakers to the left foot.  No new complaints at this time.  Past Medical History:  Diagnosis Date  . Bipolar disorder, unspecified (HCC)   . Hypertension       Objective/Physical Exam Neurovascular status intact.  Skin incisions appear to be well coapted and healed. No sign of infectious process noted. No dehiscence. No active bleeding noted.  Negative for any edema noted to the surgical extremity.  There is some limited range of motion to the first MTPJ of the bilateral great toes likely secondary to iatrogenic scar tissue  Assessment: 1. s/p bunionectomy right foot. DOS: 09/21/2019 2. S/P bunionectomy left foot. DOS: 08/17/2019   Plan of Care:  1. Patient was evaluated.  2.  Patient may discontinue cam boot right foot.  Recommend good supportive sneakers bilateral 3.  Patient may return to work full activity no restrictions beginning 11/13/2019.  4.  Recommend daily range of motion exercises to the bilateral great toe joints 5.  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   Felecia Shelling, DPM [facility]  Dr. Felecia Shelling, DPM    [address]                                        [place], [place] [address]                Office [phone]  Fax [phone]

## 2019-10-19 ENCOUNTER — Encounter: Payer: Self-pay | Admitting: Podiatry

## 2019-10-19 MED FILL — TADALAFIL 5 MG TABS: 5 | 30 days supply | Qty: 30 | Fill #6

## 2019-10-19 MED FILL — BUPROPION HCL ER (XL) 300 M: 300 | 30 days supply | Qty: 30 | Fill #4

## 2019-10-20 ENCOUNTER — Encounter: Payer: Self-pay | Admitting: *Deleted

## 2019-10-25 ENCOUNTER — Telehealth: Payer: Self-pay | Admitting: Podiatry

## 2019-10-25 NOTE — Telephone Encounter (Signed)
Pt called and wanted to know if he can return to work every Thursday  For 2hrs up until august 2nd please advise

## 2019-10-26 ENCOUNTER — Telehealth: Payer: Self-pay | Admitting: Sports Medicine

## 2019-10-26 ENCOUNTER — Encounter: Payer: Self-pay | Admitting: Podiatry

## 2019-10-26 NOTE — Telephone Encounter (Signed)
Yes he can have a work note to go in for 2 hours on Thursdays then on Aug 2 can return full time with no restrictions as Dr. Logan Bores has previously stated in his office note

## 2019-10-26 NOTE — Telephone Encounter (Signed)
Pt has called again to get an update about Dr.note

## 2019-10-26 NOTE — Telephone Encounter (Signed)
Pt has called to see about a return to work note going into work every Thursday for 2hrs until aug 2 for further eval  please assist

## 2019-10-27 ENCOUNTER — Encounter: Payer: Self-pay | Admitting: Podiatry

## 2019-11-03 MED FILL — TESTOSTERONE CYP 200 MG/ML: 200 | 28 days supply | Qty: 4 | Fill #5

## 2019-11-09 MED FILL — AMLODIPINE-BENAZEPRIL 5-20: 5-20 | 30 days supply | Qty: 60 | Fill #4

## 2019-11-09 MED FILL — BISOPROLOL FUMARATE 5 MG TA: 5 | 30 days supply | Qty: 30 | Fill #4

## 2019-11-14 MED FILL — TADALAFIL 20 MG TABS: 20 | 30 days supply | Qty: 4 | Fill #0

## 2019-11-15 MED FILL — TADALAFIL 5 MG TABS: 5 | 30 days supply | Qty: 30 | Fill #7

## 2019-11-22 MED FILL — BUPROPION HCL ER (XL) 300 M: 300 | 30 days supply | Qty: 30 | Fill #5

## 2019-11-27 ENCOUNTER — Encounter: Payer: Self-pay | Admitting: Podiatry

## 2019-12-01 MED FILL — TESTOSTERONE CYP 200 MG/ML: 200 | 28 days supply | Qty: 4 | Fill #6

## 2019-12-01 MED FILL — AMLODIPINE-BENAZEPRIL 5-20: 5-20 | 30 days supply | Qty: 60 | Fill #5

## 2019-12-14 MED FILL — BISOPROLOL FUMARATE 5 MG TA: 5 | 30 days supply | Qty: 30 | Fill #5

## 2019-12-15 ENCOUNTER — Encounter: Payer: Self-pay | Admitting: Podiatry

## 2019-12-19 MED FILL — TADALAFIL 5 MG TABS: 5 | 30 days supply | Qty: 30 | Fill #8

## 2019-12-20 ENCOUNTER — Telehealth: Payer: Self-pay | Admitting: Podiatry

## 2019-12-20 NOTE — Telephone Encounter (Signed)
Patient called to follow up. He has not heard back from the office about his swelling. Please call patient ASAP

## 2019-12-20 NOTE — Telephone Encounter (Signed)
Spoke with patient. Could someone please give him a call back and schedule him to come in for a follow up appt? Not an emergency, but sometime over the next week or two?  Thanks, Dr. Logan Bores

## 2019-12-20 NOTE — Telephone Encounter (Signed)
We need to schedule this patient for an appointment. Please remind me tomorrow. Thank you

## 2019-12-20 NOTE — Telephone Encounter (Signed)
Spoke with the patient on the phone today.  Since the patient is having some swelling to his right foot I do recommend the patient coming in for follow-up x-rays.  Follow-up in office within the next week.  Felecia Shelling, DPM [facility]  Dr. Felecia Shelling, DPM    [address] [place], [place] [address]                Office [phone]  Fax [phone]

## 2019-12-21 NOTE — Telephone Encounter (Signed)
I called the pt and LVM to call the office to schedule a  f/u appointment.

## 2019-12-25 MED FILL — buPROPion HCL ER (XL) 300 M: 300 | 30 days supply | Qty: 30 | Fill #6

## 2019-12-26 NOTE — Telephone Encounter (Signed)
Spoke to patient, will be in on 09/15 to have Dr. Logan Bores take a look at his swelling on his foot

## 2019-12-27 ENCOUNTER — Encounter: Payer: Self-pay | Admitting: Podiatry

## 2019-12-27 ENCOUNTER — Ambulatory Visit: Payer: BC Managed Care – PPO | Admitting: Podiatry

## 2020-01-01 MED FILL — TESTOSTERONE CYP 200 MG/ML: 200 | 28 days supply | Qty: 4 | Fill #0

## 2020-01-24 MED FILL — TADALAFIL 5 MG TABS: 5 | 30 days supply | Qty: 30 | Fill #9

## 2020-01-24 MED FILL — AMLODIPINE-BENAZEPRIL 5-20: 5-20 | 30 days supply | Qty: 60 | Fill #6

## 2020-01-24 MED FILL — BISOPROLOL FUMARATE 5 MG TA: 5 | 30 days supply | Qty: 30 | Fill #6

## 2020-01-24 MED FILL — buPROPion HCL ER (XL) 300 M: 300 | 30 days supply | Qty: 30 | Fill #7

## 2020-02-01 MED FILL — TESTOSTERONE CYP 200 MG/ML: 200 | 28 days supply | Qty: 4 | Fill #1

## 2020-02-23 ENCOUNTER — Other Ambulatory Visit (HOSPITAL_COMMUNITY): Payer: Self-pay | Admitting: Internal Medicine

## 2020-02-23 MED FILL — buPROPion HCL ER (XL) 300 M: 300 | 30 days supply | Qty: 30 | Fill #0

## 2020-02-23 MED FILL — AMLODIPINE-BENAZEPRIL 5-20: 5-20 | 30 days supply | Qty: 60 | Fill #0

## 2020-02-23 MED FILL — BISOPROLOL FUMARATE 5 MG TA: 5 | 30 days supply | Qty: 30 | Fill #0

## 2020-02-27 MED FILL — TADALAFIL 5 MG TABS: 5 | 30 days supply | Qty: 30 | Fill #10

## 2020-02-29 MED FILL — TESTOSTERONE CYP 200 MG/ML: 200 | 28 days supply | Qty: 4 | Fill #2

## 2020-04-03 ENCOUNTER — Other Ambulatory Visit (HOSPITAL_COMMUNITY): Payer: Self-pay | Admitting: Internal Medicine

## 2020-04-03 MED FILL — BISOPROLOL FUMARATE 5 MG TA: 5 | 30 days supply | Qty: 30 | Fill #1

## 2020-04-03 MED FILL — TESTOSTERONE CYP 200 MG/ML: 200 | 28 days supply | Qty: 4 | Fill #3

## 2020-04-03 MED FILL — TADALAFIL 5 MG TABS: 5 | 30 days supply | Qty: 30 | Fill #0

## 2020-04-03 MED FILL — buPROPion HCL ER (XL) 300 M: 300 | 30 days supply | Qty: 30 | Fill #1

## 2020-04-03 MED FILL — AMLODIPINE-BENAZEPRIL 5-20: 5-20 | 30 days supply | Qty: 60 | Fill #1

## 2020-04-19 MED FILL — TADALAFIL 5 MG TABS: 5 | 30 days supply | Qty: 30 | Fill #0

## 2020-05-03 MED FILL — BISOPROLOL FUMARATE 5 MG TA: 5 | 30 days supply | Qty: 30 | Fill #2

## 2020-05-03 MED FILL — AMLODIPINE-BENAZEPRIL 5-20: 5-20 | 30 days supply | Qty: 60 | Fill #2

## 2020-05-03 MED FILL — buPROPion HCL ER (XL) 300 M: 300 | 30 days supply | Qty: 30 | Fill #2

## 2020-05-04 ENCOUNTER — Other Ambulatory Visit (HOSPITAL_COMMUNITY): Payer: Self-pay | Admitting: Internal Medicine

## 2020-05-06 MED FILL — TESTOSTERONE CYP 200 MG/ML: 200 | 28 days supply | Qty: 4 | Fill #0

## 2020-05-17 ENCOUNTER — Other Ambulatory Visit (HOSPITAL_COMMUNITY): Payer: Self-pay | Admitting: Internal Medicine

## 2020-05-17 MED FILL — TADALAFIL 5 MG TABS: 5 | 30 days supply | Qty: 30 | Fill #0

## 2020-05-17 MED FILL — BENAZEPRIL-HYDROCHLOROTHIAZ: 20-12.5 | 30 days supply | Qty: 60 | Fill #0

## 2020-06-04 MED FILL — BISOPROLOL FUMARATE 5 MG TA: 5 | 30 days supply | Qty: 30 | Fill #3

## 2020-06-04 MED FILL — TESTOSTERONE CYP 200 MG/ML: 200 | 28 days supply | Qty: 4 | Fill #1

## 2020-06-04 MED FILL — buPROPion HCL ER (XL) 300 M: 300 | 30 days supply | Qty: 30 | Fill #3

## 2020-06-07 LAB — COLOGUARD: COLOGUARD: NEGATIVE

## 2020-06-19 MED FILL — BENAZEPRIL-HYDROCHLOROTHIAZ: 20-12.5 | 30 days supply | Qty: 60 | Fill #1

## 2020-07-03 MED FILL — BISOPROLOL FUMARATE 5 MG TA: 5 | 90 days supply | Qty: 90 | Fill #0

## 2020-07-03 MED FILL — buPROPion HCL ER (XL) 300 M: 300 | 90 days supply | Qty: 90 | Fill #0

## 2020-07-03 MED FILL — TESTOSTERONE CYP 200 MG/ML: 200 | 28 days supply | Qty: 4 | Fill #2

## 2020-07-03 MED FILL — TADALAFIL 5 MG TABS: 5 | 30 days supply | Qty: 30 | Fill #1

## 2020-07-31 ENCOUNTER — Other Ambulatory Visit (HOSPITAL_COMMUNITY): Payer: Self-pay

## 2020-07-31 MED FILL — Benazepril & Hydrochlorothiazide Tab 20-12.5 MG: ORAL | 90 days supply | Qty: 180 | Fill #0 | Status: AC

## 2020-08-01 ENCOUNTER — Other Ambulatory Visit (HOSPITAL_COMMUNITY): Payer: Self-pay

## 2020-08-01 MED FILL — Testosterone Cypionate IM Inj in Oil 200 MG/ML: INTRAMUSCULAR | 28 days supply | Qty: 4 | Fill #0 | Status: AC

## 2020-08-02 ENCOUNTER — Other Ambulatory Visit (HOSPITAL_COMMUNITY): Payer: Self-pay

## 2020-08-30 ENCOUNTER — Other Ambulatory Visit (HOSPITAL_COMMUNITY): Payer: Self-pay | Admitting: Internal Medicine

## 2020-08-30 ENCOUNTER — Other Ambulatory Visit (HOSPITAL_COMMUNITY): Payer: Self-pay

## 2020-09-01 MED ORDER — TESTOSTERONE CYPIONATE 200 MG/ML IM SOLN: INTRAMUSCULAR | 3 refills | Status: DC

## 2020-09-02 ENCOUNTER — Other Ambulatory Visit (HOSPITAL_COMMUNITY): Payer: Self-pay | Admitting: Internal Medicine

## 2020-09-02 ENCOUNTER — Other Ambulatory Visit (HOSPITAL_COMMUNITY): Payer: Self-pay

## 2020-09-02 MED ORDER — TESTOSTERONE CYPIONATE 200 MG/ML IM SOLN: INTRAMUSCULAR | 3 refills | Status: DC

## 2020-09-04 ENCOUNTER — Other Ambulatory Visit (HOSPITAL_COMMUNITY): Payer: Self-pay | Admitting: Internal Medicine

## 2020-09-04 ENCOUNTER — Other Ambulatory Visit (HOSPITAL_COMMUNITY): Payer: Self-pay

## 2020-09-04 MED ORDER — TESTOSTERONE CYPIONATE 200 MG/ML IM SOLN
100.0000 mg | INTRAMUSCULAR | 3 refills | Status: DC
  Filled 2020-09-04: qty 4, 28d supply, fill #0
  Filled 2020-10-03: qty 4, 28d supply, fill #1
  Filled 2020-11-01: qty 4, 28d supply, fill #2
  Filled 2020-11-27 – 2020-11-28 (×2): qty 4, 28d supply, fill #3

## 2020-09-04 MED FILL — Tadalafil Tab 5 MG: ORAL | 30 days supply | Qty: 30 | Fill #0 | Status: AC

## 2020-09-06 ENCOUNTER — Other Ambulatory Visit (HOSPITAL_COMMUNITY): Payer: Self-pay

## 2020-09-06 MED ORDER — TESTOSTERONE CYPIONATE 200 MG/ML IM SOLN
INTRAMUSCULAR | 3 refills | Status: DC
  Filled 2020-09-06: qty 4, 28d supply, fill #0

## 2020-10-03 MED FILL — Bisoprolol Fumarate Tab 5 MG: ORAL | 90 days supply | Qty: 90 | Fill #0 | Status: AC

## 2020-10-03 MED FILL — Bupropion HCl Tab ER 24HR 300 MG: ORAL | 90 days supply | Qty: 90 | Fill #0 | Status: AC

## 2020-10-04 ENCOUNTER — Other Ambulatory Visit (HOSPITAL_COMMUNITY): Payer: Self-pay

## 2020-10-17 MED FILL — Tadalafil Tab 5 MG: ORAL | 30 days supply | Qty: 30 | Fill #0 | Status: AC

## 2020-10-18 ENCOUNTER — Other Ambulatory Visit (HOSPITAL_COMMUNITY): Payer: Self-pay

## 2020-11-01 ENCOUNTER — Other Ambulatory Visit (HOSPITAL_COMMUNITY): Payer: Self-pay

## 2020-11-22 ENCOUNTER — Other Ambulatory Visit (HOSPITAL_COMMUNITY): Payer: Self-pay

## 2020-11-22 MED FILL — Benazepril & Hydrochlorothiazide Tab 20-12.5 MG: ORAL | 30 days supply | Qty: 60 | Fill #1 | Status: AC

## 2020-11-27 ENCOUNTER — Other Ambulatory Visit (HOSPITAL_COMMUNITY): Payer: Self-pay

## 2020-11-28 ENCOUNTER — Other Ambulatory Visit (HOSPITAL_COMMUNITY): Payer: Self-pay

## 2020-12-27 ENCOUNTER — Other Ambulatory Visit (HOSPITAL_COMMUNITY): Payer: Self-pay

## 2020-12-30 ENCOUNTER — Other Ambulatory Visit (HOSPITAL_COMMUNITY): Payer: Self-pay

## 2020-12-30 MED ORDER — TESTOSTERONE CYPIONATE 200 MG/ML IM SOLN
100.0000 mg | INTRAMUSCULAR | 3 refills | Status: DC
  Filled 2020-12-30: qty 4, 28d supply, fill #0
  Filled 2021-02-03: qty 4, 28d supply, fill #1
  Filled 2021-03-05: qty 4, 28d supply, fill #2
  Filled 2021-04-03: qty 4, 28d supply, fill #3

## 2021-01-02 ENCOUNTER — Other Ambulatory Visit (HOSPITAL_COMMUNITY): Payer: Self-pay

## 2021-01-02 MED FILL — Bupropion HCl Tab ER 24HR 300 MG: ORAL | 90 days supply | Qty: 90 | Fill #1 | Status: AC

## 2021-01-02 MED FILL — Bisoprolol Fumarate Tab 5 MG: ORAL | 90 days supply | Qty: 90 | Fill #1 | Status: AC

## 2021-01-02 MED FILL — Benazepril & Hydrochlorothiazide Tab 20-12.5 MG: ORAL | 30 days supply | Qty: 60 | Fill #2 | Status: AC

## 2021-01-21 ENCOUNTER — Other Ambulatory Visit (HOSPITAL_COMMUNITY): Payer: Self-pay

## 2021-01-21 MED ORDER — CYCLOBENZAPRINE HCL 10 MG PO TABS
10.0000 mg | ORAL_TABLET | Freq: Three times a day (TID) | ORAL | 0 refills | Status: DC | PRN
  Filled 2021-01-21: qty 40, 14d supply, fill #0

## 2021-01-21 MED ORDER — TRAMADOL HCL 50 MG PO TABS
50.0000 mg | ORAL_TABLET | Freq: Three times a day (TID) | ORAL | 0 refills | Status: DC | PRN
  Filled 2021-01-21: qty 21, 7d supply, fill #0

## 2021-01-21 MED ORDER — MELOXICAM 15 MG PO TABS
15.0000 mg | ORAL_TABLET | Freq: Every day | ORAL | 0 refills | Status: DC
  Filled 2021-01-21: qty 30, 30d supply, fill #0

## 2021-01-29 ENCOUNTER — Other Ambulatory Visit (HOSPITAL_COMMUNITY): Payer: Self-pay

## 2021-01-29 MED FILL — Tadalafil Tab 5 MG: ORAL | 30 days supply | Qty: 30 | Fill #1 | Status: AC

## 2021-02-03 ENCOUNTER — Other Ambulatory Visit (HOSPITAL_COMMUNITY): Payer: Self-pay

## 2021-02-03 MED FILL — Tadalafil Tab 5 MG: ORAL | 30 days supply | Qty: 30 | Fill #1 | Status: CN

## 2021-02-25 ENCOUNTER — Other Ambulatory Visit (HOSPITAL_COMMUNITY): Payer: Self-pay

## 2021-02-25 MED FILL — Benazepril & Hydrochlorothiazide Tab 20-12.5 MG: ORAL | 30 days supply | Qty: 60 | Fill #3 | Status: AC

## 2021-02-27 ENCOUNTER — Other Ambulatory Visit (HOSPITAL_COMMUNITY): Payer: Self-pay

## 2021-03-03 ENCOUNTER — Other Ambulatory Visit (HOSPITAL_COMMUNITY): Payer: Self-pay

## 2021-03-03 MED ORDER — MELOXICAM 15 MG PO TABS
15.0000 mg | ORAL_TABLET | Freq: Every day | ORAL | 0 refills | Status: DC
  Filled 2021-03-03: qty 30, 30d supply, fill #0

## 2021-03-03 MED ORDER — TRAMADOL HCL 50 MG PO TABS
50.0000 mg | ORAL_TABLET | Freq: Three times a day (TID) | ORAL | 0 refills | Status: DC | PRN
  Filled 2021-03-03: qty 30, 10d supply, fill #0

## 2021-03-05 ENCOUNTER — Other Ambulatory Visit (HOSPITAL_COMMUNITY): Payer: Self-pay

## 2021-04-03 ENCOUNTER — Other Ambulatory Visit (HOSPITAL_COMMUNITY): Payer: Self-pay

## 2021-04-15 ENCOUNTER — Other Ambulatory Visit (HOSPITAL_COMMUNITY): Payer: Self-pay

## 2021-04-15 MED FILL — Bisoprolol Fumarate Tab 5 MG: ORAL | 90 days supply | Qty: 90 | Fill #2 | Status: AC

## 2021-04-15 MED FILL — Benazepril & Hydrochlorothiazide Tab 20-12.5 MG: ORAL | 30 days supply | Qty: 60 | Fill #4 | Status: AC

## 2021-04-15 MED FILL — Bupropion HCl Tab ER 24HR 300 MG: ORAL | 90 days supply | Qty: 90 | Fill #2 | Status: AC

## 2021-05-02 ENCOUNTER — Other Ambulatory Visit (HOSPITAL_COMMUNITY): Payer: Self-pay

## 2021-05-02 MED FILL — Tadalafil Tab 5 MG: ORAL | 30 days supply | Qty: 30 | Fill #2 | Status: AC

## 2021-05-03 ENCOUNTER — Other Ambulatory Visit (HOSPITAL_COMMUNITY): Payer: Self-pay

## 2021-05-03 MED ORDER — TESTOSTERONE CYPIONATE 200 MG/ML IM SOLN
100.0000 mg | INTRAMUSCULAR | 3 refills | Status: DC
  Filled 2021-05-03: qty 4, 28d supply, fill #0
  Filled 2021-06-05: qty 4, 28d supply, fill #1

## 2021-05-05 ENCOUNTER — Other Ambulatory Visit (HOSPITAL_COMMUNITY): Payer: Self-pay

## 2021-05-06 ENCOUNTER — Other Ambulatory Visit (HOSPITAL_COMMUNITY): Payer: Self-pay

## 2021-05-06 MED ORDER — DICYCLOMINE HCL 10 MG PO CAPS
10.0000 mg | ORAL_CAPSULE | Freq: Four times a day (QID) | ORAL | 0 refills | Status: DC
  Filled 2021-05-06: qty 120, 30d supply, fill #0

## 2021-06-02 ENCOUNTER — Other Ambulatory Visit: Payer: Self-pay

## 2021-06-05 ENCOUNTER — Other Ambulatory Visit (HOSPITAL_COMMUNITY): Payer: Self-pay

## 2021-06-06 ENCOUNTER — Other Ambulatory Visit (HOSPITAL_COMMUNITY): Payer: Self-pay

## 2021-06-06 MED ORDER — BENAZEPRIL-HYDROCHLOROTHIAZIDE 20-12.5 MG PO TABS
1.0000 | ORAL_TABLET | Freq: Two times a day (BID) | ORAL | 1 refills | Status: DC
  Filled 2021-06-06 – 2021-06-13 (×2): qty 180, 90d supply, fill #0

## 2021-06-09 ENCOUNTER — Other Ambulatory Visit (HOSPITAL_COMMUNITY): Payer: Self-pay

## 2021-06-12 ENCOUNTER — Other Ambulatory Visit (HOSPITAL_COMMUNITY): Payer: Self-pay

## 2021-06-13 ENCOUNTER — Other Ambulatory Visit (HOSPITAL_COMMUNITY): Payer: Self-pay

## 2021-06-19 ENCOUNTER — Other Ambulatory Visit (HOSPITAL_COMMUNITY): Payer: Self-pay

## 2021-07-09 ENCOUNTER — Other Ambulatory Visit (HOSPITAL_COMMUNITY): Payer: Self-pay

## 2021-10-22 ENCOUNTER — Other Ambulatory Visit (HOSPITAL_COMMUNITY): Payer: Self-pay

## 2021-10-31 ENCOUNTER — Other Ambulatory Visit (HOSPITAL_COMMUNITY): Payer: Self-pay

## 2021-11-11 ENCOUNTER — Other Ambulatory Visit (HOSPITAL_COMMUNITY): Payer: Self-pay

## 2021-11-12 ENCOUNTER — Other Ambulatory Visit (HOSPITAL_COMMUNITY): Payer: Self-pay

## 2021-11-12 MED ORDER — TADALAFIL 5 MG PO TABS
5.0000 mg | ORAL_TABLET | Freq: Every day | ORAL | 0 refills | Status: DC
  Filled 2021-11-12: qty 30, 30d supply, fill #0

## 2021-11-20 ENCOUNTER — Other Ambulatory Visit (HOSPITAL_COMMUNITY): Payer: Self-pay

## 2021-12-17 ENCOUNTER — Ambulatory Visit: Payer: BC Managed Care – PPO | Admitting: Sports Medicine

## 2022-04-21 DIAGNOSIS — E291 Testicular hypofunction: Secondary | ICD-10-CM | POA: Diagnosis not present

## 2022-05-15 ENCOUNTER — Telehealth: Payer: Self-pay

## 2022-05-15 NOTE — Telephone Encounter (Signed)
Sending mychart msg. AS, CMA 

## 2022-07-08 DIAGNOSIS — H5213 Myopia, bilateral: Secondary | ICD-10-CM | POA: Diagnosis not present

## 2022-08-04 DIAGNOSIS — F331 Major depressive disorder, recurrent, moderate: Secondary | ICD-10-CM | POA: Diagnosis not present

## 2022-08-12 DIAGNOSIS — F331 Major depressive disorder, recurrent, moderate: Secondary | ICD-10-CM | POA: Diagnosis not present

## 2022-08-18 DIAGNOSIS — F331 Major depressive disorder, recurrent, moderate: Secondary | ICD-10-CM | POA: Diagnosis not present

## 2022-08-21 DIAGNOSIS — F411 Generalized anxiety disorder: Secondary | ICD-10-CM | POA: Diagnosis not present

## 2022-08-21 DIAGNOSIS — F4310 Post-traumatic stress disorder, unspecified: Secondary | ICD-10-CM | POA: Diagnosis not present

## 2022-08-21 DIAGNOSIS — F331 Major depressive disorder, recurrent, moderate: Secondary | ICD-10-CM | POA: Diagnosis not present

## 2022-09-03 DIAGNOSIS — F331 Major depressive disorder, recurrent, moderate: Secondary | ICD-10-CM | POA: Diagnosis not present

## 2022-09-04 DIAGNOSIS — H5213 Myopia, bilateral: Secondary | ICD-10-CM | POA: Diagnosis not present

## 2022-09-04 DIAGNOSIS — H52203 Unspecified astigmatism, bilateral: Secondary | ICD-10-CM | POA: Diagnosis not present

## 2022-09-15 DIAGNOSIS — R7989 Other specified abnormal findings of blood chemistry: Secondary | ICD-10-CM | POA: Diagnosis not present

## 2022-09-15 DIAGNOSIS — Z0000 Encounter for general adult medical examination without abnormal findings: Secondary | ICD-10-CM | POA: Diagnosis not present

## 2022-09-15 DIAGNOSIS — E785 Hyperlipidemia, unspecified: Secondary | ICD-10-CM | POA: Diagnosis not present

## 2022-09-15 DIAGNOSIS — Z1211 Encounter for screening for malignant neoplasm of colon: Secondary | ICD-10-CM | POA: Diagnosis not present

## 2022-09-15 DIAGNOSIS — I10 Essential (primary) hypertension: Secondary | ICD-10-CM | POA: Diagnosis not present

## 2022-09-15 DIAGNOSIS — F330 Major depressive disorder, recurrent, mild: Secondary | ICD-10-CM | POA: Diagnosis not present

## 2022-09-16 DIAGNOSIS — F331 Major depressive disorder, recurrent, moderate: Secondary | ICD-10-CM | POA: Diagnosis not present

## 2022-10-05 DIAGNOSIS — Z79899 Other long term (current) drug therapy: Secondary | ICD-10-CM | POA: Diagnosis not present

## 2022-10-05 DIAGNOSIS — E876 Hypokalemia: Secondary | ICD-10-CM | POA: Diagnosis not present

## 2022-10-05 DIAGNOSIS — H65191 Other acute nonsuppurative otitis media, right ear: Secondary | ICD-10-CM | POA: Diagnosis not present

## 2022-10-05 DIAGNOSIS — R7989 Other specified abnormal findings of blood chemistry: Secondary | ICD-10-CM | POA: Diagnosis not present

## 2022-10-05 DIAGNOSIS — E782 Mixed hyperlipidemia: Secondary | ICD-10-CM | POA: Diagnosis not present

## 2022-10-05 DIAGNOSIS — R7401 Elevation of levels of liver transaminase levels: Secondary | ICD-10-CM | POA: Diagnosis not present

## 2022-10-26 DIAGNOSIS — F331 Major depressive disorder, recurrent, moderate: Secondary | ICD-10-CM | POA: Diagnosis not present

## 2022-10-26 DIAGNOSIS — F4310 Post-traumatic stress disorder, unspecified: Secondary | ICD-10-CM | POA: Diagnosis not present

## 2022-10-26 DIAGNOSIS — F411 Generalized anxiety disorder: Secondary | ICD-10-CM | POA: Diagnosis not present

## 2022-11-09 DIAGNOSIS — R7989 Other specified abnormal findings of blood chemistry: Secondary | ICD-10-CM | POA: Diagnosis not present

## 2022-11-09 DIAGNOSIS — G8929 Other chronic pain: Secondary | ICD-10-CM | POA: Diagnosis not present

## 2022-11-09 DIAGNOSIS — M25561 Pain in right knee: Secondary | ICD-10-CM | POA: Diagnosis not present

## 2022-11-12 DIAGNOSIS — M25561 Pain in right knee: Secondary | ICD-10-CM | POA: Diagnosis not present

## 2022-11-17 DIAGNOSIS — M7041 Prepatellar bursitis, right knee: Secondary | ICD-10-CM | POA: Diagnosis not present

## 2022-11-26 DIAGNOSIS — D125 Benign neoplasm of sigmoid colon: Secondary | ICD-10-CM | POA: Diagnosis not present

## 2022-11-26 DIAGNOSIS — Z1211 Encounter for screening for malignant neoplasm of colon: Secondary | ICD-10-CM | POA: Diagnosis not present

## 2022-11-26 DIAGNOSIS — D122 Benign neoplasm of ascending colon: Secondary | ICD-10-CM | POA: Diagnosis not present

## 2022-12-08 DIAGNOSIS — M545 Low back pain, unspecified: Secondary | ICD-10-CM | POA: Diagnosis not present

## 2022-12-11 DIAGNOSIS — M7041 Prepatellar bursitis, right knee: Secondary | ICD-10-CM | POA: Diagnosis not present

## 2022-12-14 ENCOUNTER — Encounter (HOSPITAL_COMMUNITY): Payer: Self-pay

## 2022-12-14 ENCOUNTER — Emergency Department (HOSPITAL_COMMUNITY): Payer: Medicaid Other

## 2022-12-14 ENCOUNTER — Other Ambulatory Visit: Payer: Self-pay

## 2022-12-14 ENCOUNTER — Emergency Department (HOSPITAL_COMMUNITY)
Admission: EM | Admit: 2022-12-14 | Discharge: 2022-12-15 | Disposition: A | Discharge status: Home / Self Care | Payer: Medicaid Other | Attending: Emergency Medicine | Admitting: Emergency Medicine

## 2022-12-14 DIAGNOSIS — Z79899 Other long term (current) drug therapy: Secondary | ICD-10-CM | POA: Insufficient documentation

## 2022-12-14 DIAGNOSIS — I10 Essential (primary) hypertension: Secondary | ICD-10-CM | POA: Insufficient documentation

## 2022-12-14 DIAGNOSIS — M25561 Pain in right knee: Secondary | ICD-10-CM | POA: Diagnosis not present

## 2022-12-14 DIAGNOSIS — D72829 Elevated white blood cell count, unspecified: Secondary | ICD-10-CM | POA: Diagnosis not present

## 2022-12-14 DIAGNOSIS — R7989 Other specified abnormal findings of blood chemistry: Secondary | ICD-10-CM | POA: Diagnosis not present

## 2022-12-14 LAB — CBC WITH DIFFERENTIAL/PLATELET
Abs Immature Granulocytes: 0.02 10*3/uL (ref 0.00–0.07)
Basophils Absolute: 0 10*3/uL (ref 0.0–0.1)
Basophils Relative: 1 %
Eosinophils Absolute: 0.2 10*3/uL (ref 0.0–0.5)
Eosinophils Relative: 3 %
HCT: 42.8 % (ref 39.0–52.0)
Hemoglobin: 14.1 g/dL (ref 13.0–17.0)
Immature Granulocytes: 0 %
Lymphocytes Relative: 22 %
Lymphs Abs: 1.4 10*3/uL (ref 0.7–4.0)
MCH: 31.9 pg (ref 26.0–34.0)
MCHC: 32.9 g/dL (ref 30.0–36.0)
MCV: 96.8 fL (ref 80.0–100.0)
Monocytes Absolute: 0.7 10*3/uL (ref 0.1–1.0)
Monocytes Relative: 11 %
Neutro Abs: 3.9 10*3/uL (ref 1.7–7.7)
Neutrophils Relative %: 63 %
Platelets: 247 10*3/uL (ref 150–400)
RBC: 4.42 MIL/uL (ref 4.22–5.81)
RDW: 13.2 % (ref 11.5–15.5)
WBC: 6.3 10*3/uL (ref 4.0–10.5)
nRBC: 0 % (ref 0.0–0.2)

## 2022-12-14 LAB — BASIC METABOLIC PANEL
Anion gap: 10 (ref 5–15)
BUN: 17 mg/dL (ref 6–20)
CO2: 23 mmol/L (ref 22–32)
Calcium: 9 mg/dL (ref 8.9–10.3)
Chloride: 105 mmol/L (ref 98–111)
Creatinine, Ser: 1.64 mg/dL — ABNORMAL HIGH (ref 0.61–1.24)
GFR, Estimated: 50 mL/min — ABNORMAL LOW (ref >60)
Glucose, Bld: 92 mg/dL (ref 70–99)
Potassium: 3.9 mmol/L (ref 3.5–5.1)
Sodium: 138 mmol/L (ref 135–145)

## 2022-12-14 LAB — SEDIMENTATION RATE: Sed Rate: 1 mm/hr (ref 0–16)

## 2022-12-14 NOTE — ED Provider Notes (Signed)
[place] [facility] AT [facility] Provider Note   CSN: [id number] Arrival date & time: 12/14/22  1906     History {Add pertinent medical, surgical, social history, OB history to HPI:1} Chief Complaint  Patient presents with   Knee Pain    Devon Hayden is a 52 y.o. male.   Knee Pain      Home Medications Prior to Admission medications   Medication Sig Start Date End Date Taking? Authorizing Provider  amLODipine (NORVASC) 10 MG tablet Take 1 tablet (10 mg total) by mouth daily. 01/29/11   Donato Schultz, DO  amLODipine-benazepril (LOTREL) 5-20 MG capsule Take by mouth. 06/22/18 06/22/19  [provider]  benazepril-hydrochlorthiazide (LOTENSIN HCT) 20-12.5 MG tablet TAKE 1 TABLET BY MOUTH 2 (TWO) TIMES DAILY 05/17/20 05/17/21  Marguarite Arbour, MD  benazepril-hydrochlorthiazide (LOTENSIN HCT) 20-12.5 MG tablet Take 1 tablet by mouth 2 (two) times daily 06/06/21     bisoprolol (ZEBETA) 5 MG tablet Take by mouth. 06/22/18 06/22/19  [provider]  bisoprolol (ZEBETA) 5 MG tablet TAKE 1 TABLET (5 MG TOTAL) BY MOUTH ONCE DAILY 05/17/20 07/14/21  Marguarite Arbour, MD  bisoprolol (ZEBETA) 5 MG tablet TAKE 1 TABLET (5 MG TOTAL) BY MOUTH ONCE DAILY 02/23/20 02/22/21  Marguarite Arbour, MD  buPROPion (WELLBUTRIN XL) 150 MG 24 hr tablet Take 150 mg by mouth every morning. 10/18/17 10/18/18  [provider]  buPROPion (WELLBUTRIN XL) 300 MG 24 hr tablet Take 300 mg by mouth daily. 09/15/19   [provider]  buPROPion (WELLBUTRIN XL) 300 MG 24 hr tablet TAKE 1 TABLET (300 MG TOTAL) BY MOUTH ONCE DAILY 05/17/20 07/14/21  Marguarite Arbour, MD  buPROPion (WELLBUTRIN XL) 300 MG 24 hr tablet TAKE 1 TABLET (300 MG TOTAL) BY MOUTH ONCE DAILY 02/23/20 02/22/21  Marguarite Arbour, MD  cyclobenzaprine (FLEXERIL) 10 MG tablet Take 1 tablet (10 mg total) by mouth every 8 (eight) hours as needed for spasms 01/21/21     diclofenac (VOLTAREN) 75  MG EC tablet Take 1 tablet (75 mg total) by mouth 2 (two) times daily. 12/05/18   Felecia Shelling, DPM  dicyclomine (BENTYL) 10 MG capsule Take 1 capsule (10 mg total) by mouth 4 (four) times daily before meals and nightly As needed for abdominal spasms. 05/06/21     hydrochlorothiazide (HYDRODIURIL) 12.5 MG tablet Take by mouth. 10/13/17   [provider]  ibuprofen (ADVIL,MOTRIN) 400 MG tablet Take 1 tablet (400 mg total) by mouth every 6 (six) hours as needed for mild pain or moderate pain. 11/09/17   Antony Madura, PA-C  losartan-hydrochlorothiazide (HYZAAR) 100-12.5 MG tablet Take 1 tablet by mouth daily. 09/02/17   [provider]  meloxicam (MOBIC) 15 MG tablet TAKE 1 TABLET BY MOUTH EVERY DAY 09/14/19   Felecia Shelling, DPM  meloxicam (MOBIC) 15 MG tablet Take 1 tablet (15 mg total) by mouth daily with meals 03/03/21     oxyCODONE-acetaminophen (PERCOCET/ROXICET) 5-325 MG tablet Take 1 tablet by mouth every 6 (six) hours as needed for severe pain. 09/21/19   Felecia Shelling, DPM  tadalafil (CIALIS) 5 MG tablet Take by mouth.    [provider]  tadalafil (CIALIS) 5 MG tablet TAKE 1 TABLET (5 MG TOTAL) BY MOUTH ONCE DAILY 05/17/20 06/01/21  Marguarite Arbour, MD  tadalafil (CIALIS) 5 MG tablet TAKE 1 TABLET BY MOUTH ONCE DAILY 04/03/20 04/03/21  Marguarite Arbour, MD  tadalafil (CIALIS) 5 MG  tablet Take 1 tablet (5 mg total) by mouth daily. 11/12/21     testosterone cypionate (DEPO-TESTOSTERONE) 200 MG/ML injection Inject 200 mg into the muscle every Saturday. 10/18/17   [provider]  testosterone cypionate (DEPOTESTOSTERONE CYPIONATE) 200 MG/ML injection INJECT 0.5 MLS INTO THE MUSCLE EVERY 7 DAYS 09/01/20 02/28/21  Marguarite Arbour, MD  testosterone cypionate (DEPOTESTOSTERONE CYPIONATE) 200 MG/ML injection INJECT 0.5 MLS INTO THE MUSCLE EVERY 7 DAYS 09/02/20 03/01/21  Marguarite Arbour, MD  testosterone cypionate (DEPOTESTOSTERONE CYPIONATE) 200 MG/ML injection Inject 0.5  mLs (100 mg total) into the muscle every 7 (seven) days 09/05/20     testosterone cypionate (DEPOTESTOSTERONE CYPIONATE) 200 MG/ML injection Inject 0.5 mLs (100 mg total) into the muscle every 7 (seven) days. 05/03/21     traMADol (ULTRAM) 50 MG tablet Take 1 tablet (50 mg total) by mouth every 8 (eight) hours as needed  for pain 01/21/21     traMADol (ULTRAM) 50 MG tablet Take 1 tablet (50 mg total) by mouth every 8 (eight) hours as needed for pain 03/03/21         Allergies    Patient has no known allergies.    Review of Systems   Review of Systems  Physical Exam Updated Vital Signs BP 128/79 (BP Location: Right Arm)   Pulse 96   Temp 98.4 F (36.9 C) (Oral)   Resp 18   Wt 90.7 kg   SpO2 98%  Physical Exam  ED Results / Procedures / Treatments   Labs (all labs ordered are listed, but only abnormal results are displayed) Labs Reviewed  BASIC METABOLIC PANEL - Abnormal; Notable for the following components:      Result Value   Creatinine, Ser 1.64 (*)    GFR, Estimated 50 (*)    All other components within normal limits  CBC WITH DIFFERENTIAL/PLATELET  SEDIMENTATION RATE  C-REACTIVE PROTEIN    EKG None  Radiology DG Knee Complete 4 Views Right  Result Date: 12/14/2022 CLINICAL DATA:  pain and swelling EXAM: RIGHT KNEE - COMPLETE 4+ VIEW COMPARISON:  None Available. FINDINGS: Old nonunionized slightly superiorly displaced tibial tuberosity fracture. No evidence of fracture, dislocation, or joint effusion. No evidence of arthropathy or other focal bone abnormality. Subcutaneus soft tissue edema of the anterior knee with associated hypophysis fat pad stranding. IMPRESSION: Subcutaneus soft tissue edema of the anterior knee with associated hypophysis fat pad stranding. Electronically Signed   By: Tish Frederickson M.D.   On: 12/14/2022 21:58    Procedures Procedures  {Document cardiac monitor, telemetry assessment procedure when appropriate:1}  Medications Ordered in  ED Medications - No data to display  ED Course/ Medical Decision Making/ A&P Clinical Course as of 12/14/22 2320  [facility] Dec 14, 2022  2208 DG Knee Complete 4 Views Right [RR]    Clinical Course User Index [RR] Achille Rich, PA-C   {   Click here for ABCD2, HEART and other calculatorsREFRESH Note before signing :1}                              Medical Decision Making Amount and/or Complexity of Data Reviewed Labs: ordered. Radiology: ordered. Decision-making details documented in ED Course.   ***  {Document critical care time when appropriate:1} {Document review of labs and clinical decision tools ie heart score, Chads2Vasc2 etc:1}  {Document your independent review of radiology images, and any outside records:1} {Document your discussion with family members, caretakers, and  with consultants:1} {Document social determinants of health affecting pt's care:1} {Document your decision making why or why not admission, treatments were needed:1} Final Clinical Impression(s) / ED Diagnoses Final diagnoses:  None    Rx / DC Orders ED Discharge Orders     None

## 2022-12-14 NOTE — ED Triage Notes (Signed)
Pt arrives with c/o right knee pain. Pt has been recently treated for septic bursitis and has been through 2 outpt antibiotic regimens. Per pt, his pain has increase after resuming regular activities per MDs orders. Pt does has significant edema in right lower leg. Pt denies CP or SOB.

## 2022-12-14 NOTE — ED Notes (Signed)
PT injured his right knee during a bike ride about one month prior.  He states that it has been inflamed and painful ever since.  He has been to ortho and they encouraged him to keep moving the leg. He stated that he had been doing that and had worsening of pani and is now barely able to walk.  He stated he had been on antibiotics x2.

## 2022-12-15 LAB — C-REACTIVE PROTEIN: CRP: 0.8 mg/dL (ref <1.0)

## 2022-12-15 MED ORDER — METHYLPREDNISOLONE 4 MG PO TBPK
ORAL_TABLET | Freq: Every day | ORAL | 0 refills | Status: AC
Start: 2022-12-15 — End: 2022-12-21

## 2022-12-15 MED ORDER — OXYCODONE-ACETAMINOPHEN 5-325 MG PO TABS
1.0000 | ORAL_TABLET | Freq: Once | ORAL | Status: AC
  Administered 2022-12-15: 1 via ORAL
  Filled 2022-12-15: qty 1

## 2022-12-15 MED ORDER — LIDOCAINE 5 % EX PTCH
1.0000 | MEDICATED_PATCH | CUTANEOUS | Status: DC
  Administered 2022-12-15: 1 via TRANSDERMAL
  Filled 2022-12-15: qty 1

## 2022-12-15 MED ORDER — OXYCODONE-ACETAMINOPHEN 5-325 MG PO TABS
1.0000 | ORAL_TABLET | Freq: Four times a day (QID) | ORAL | 0 refills | Status: DC | PRN
Start: 2022-12-15 — End: 2023-02-11

## 2022-12-15 MED ORDER — METHYLPREDNISOLONE 4 MG PO TBPK
ORAL_TABLET | Freq: Every day | ORAL | 0 refills | Status: DC
Start: 2022-12-15 — End: 2022-12-15

## 2022-12-15 NOTE — Discharge Instructions (Addendum)
You were seen in the emerged from today for evaluation of your right knee pain.  I have discussed your case with orthopedics.  Your inflammatory markers were negative.  They report that this is likely bursitis and does not need antibiotics at this time.  I am going to send you home with some prednisone to help with some of the inflammation to see if this helps with your pain.  I do recommend taking Tylenol 650 mg every 6 hours as needed for pain.  Do not take any ibuprofen with this medication as it can hurt your stomach.  I will send you home with a few narcotic pain medication take for breakthrough pain.  Please do not drive or operate heavy shearing while on this medication as it make you sleepy.  Ultimately, you will need to follow-up with orthopedics as this will need closer evaluation and possible further imaging.  I have included information for Dr. Christell Constant to the discharge report however you can follow-up with [facility] as well.  I also recommend lidocaine patches over the need to see if this helps you.  You can also try the Ace bandage as well.  I am included more omission of the RICE method to the discharge paperwork as well.  Please review.  If he start having worsening pain, worsening swelling, fever, inability to bend or straighten the knee, please return to your nearest emergency department for evaluation.  If you have any other concerns, new or worsening symptoms, please return to the nearest emerged department for evaluation.   Contact a doctor if: The knee pain does not stop. The knee pain changes or gets worse. You have a fever along with knee pain. Your knee is red or feels warm when you touch it. Your knee gives out or locks up. Get help right away if: Your knee swells, and the swelling gets worse. You cannot move your knee. You have very bad knee pain that does not get better with pain medicine.

## 2022-12-16 ENCOUNTER — Encounter: Payer: Self-pay | Admitting: Physician Assistant

## 2022-12-16 ENCOUNTER — Ambulatory Visit (INDEPENDENT_AMBULATORY_CARE_PROVIDER_SITE_OTHER): Payer: Medicaid Other | Admitting: Physician Assistant

## 2022-12-16 DIAGNOSIS — M25561 Pain in right knee: Secondary | ICD-10-CM

## 2022-12-16 DIAGNOSIS — G8929 Other chronic pain: Secondary | ICD-10-CM

## 2022-12-16 NOTE — Progress Notes (Signed)
Office Visit Note   Patient: Devon Hayden           Date of Birth: June 24, 1970           MRN: [id number] Visit Date: 12/16/2022              Requested by: Marguarite Arbour, MD [address] [facility] [place],  [place] [address] PCP: Marguarite Arbour, MD  Chief Complaint  Patient presents with   Right Knee - Pain    Pain and swelling from bike ride  he is still in pain  went to emergency room on labor day and was given prednisone       HPI: Devon Hayden is a pleasant 52 year old gentleman with a 6-week history of right anterior knee pain.  He is normally extremely active and works out.  About 6 weeks ago he went for an extended bicycle ride.  He had immediate onset of pain in the front of his knee afterwards.  He had associated swelling.  He has gone to several different providers who evaluated him and had x-rays.  X-rays do not show any acute injuries he does have an ossicle off the tibial tubercle which more than likely is chronic.  He is having difficulty bearing weight on his knee.  He has tried a self-directed exercise program.  He has tried 2 rounds of antibiotics though he denies any fever or chills he also is currently starting a steroid Dosepak again no improvement  Assessment & Plan: Visit Diagnoses:  1. Chronic pain of right knee     Plan: He is extremely tender over the patella tendon insertion and has difficulty keeping the leg extended.  Cannot rule out a partial tear of his patella tendon with degeneration.  Tello tendinosis.  Have recommended an MRI of the right knee as it has been greater than 6-week and she had tried several different treatments including rest good directed exercise steroids and 2 rounds of antibiotics without any improvement.  He may follow-up with Dr. Roda Shutters to review the MRI in the meantime I recommended a knee support   Follow-Up Instructions: No follow-ups on file.   Ortho Exam  Patient is alert, oriented, no  adenopathy, well-dressed, normal affect, normal respiratory effort. Examination of his right knee he has no redness no effusion no prepatellar bursitis.  No tenderness over the medial lateral joint line.  Has good varus valgus and anterior stability.  Compartments are soft and nontender he is focally tender over the patella tendon and the distal insertion into the tibial tubercle.  Has difficulty sustaining a straight leg raise against resistance  Imaging: No results found. No images are attached to the encounter.  Labs: Lab Results  Component Value Date   HGBA1C 5.9 01/02/2011   ESRSEDRATE 1 12/14/2022   CRP 0.8 12/14/2022   CRP 1.1 (H) 11/09/2017     Lab Results  Component Value Date   ALBUMIN 2.7 (L) 01/02/2011   ALBUMIN 3.2 (L) 12/31/2010   ALBUMIN 4.2 12/19/2010    No results found for: "MG" No results found for: "VD25OH"  No results found for: "PREALBUMIN"    Latest Ref Rng & Units 12/14/2022    7:39 PM 11/09/2017   12:10 AM 01/02/2011    4:40 AM  CBC EXTENDED  WBC 4.0 - 10.5 K/uL 6.3  7.0  6.4   RBC 4.22 - 5.81 MIL/uL 4.42  4.95  3.70   Hemoglobin 13.0 - 17.0 g/dL 14.1  15.5  12.0   HCT 39.0 - 52.0 % 42.8  47.2  35.1   Platelets 150 - 400 K/uL 247  214  133   NEUT# 1.7 - 7.7 K/uL 3.9     Lymph# 0.7 - 4.0 K/uL 1.4        There is no height or weight on file to calculate BMI.  Orders:  Orders Placed This Encounter  Procedures   MR Knee Right w/o contrast   No orders of the defined types were placed in this encounter.    Procedures: No procedures performed  Clinical Data: No additional findings.  ROS:  All other systems negative, except as noted in the HPI. Review of Systems  Objective: Vital Signs: There were no vitals taken for this visit.  Specialty Comments:  No specialty comments available.  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 No pertinent surgical history. Social History   Occupational History   Not on file  Tobacco Use   Smoking status: Former   Smokeless tobacco: Never  Substance and Sexual Activity   Alcohol use: Yes   Drug use: Never   Sexual activity: Not on file

## 2022-12-18 ENCOUNTER — Ambulatory Visit (HOSPITAL_COMMUNITY)
Admission: RE | Admit: 2022-12-18 | Discharge: 2022-12-18 | Disposition: A | Discharge status: Home / Self Care | Payer: Medicaid Other | Source: Ambulatory Visit | Attending: Physician Assistant | Admitting: Physician Assistant

## 2022-12-18 DIAGNOSIS — M25561 Pain in right knee: Secondary | ICD-10-CM | POA: Insufficient documentation

## 2022-12-18 DIAGNOSIS — G8929 Other chronic pain: Secondary | ICD-10-CM | POA: Diagnosis not present

## 2022-12-18 DIAGNOSIS — M25461 Effusion, right knee: Secondary | ICD-10-CM | POA: Diagnosis not present

## 2022-12-18 DIAGNOSIS — R609 Edema, unspecified: Secondary | ICD-10-CM | POA: Diagnosis not present

## 2022-12-21 ENCOUNTER — Encounter: Payer: Self-pay | Admitting: Orthopaedic Surgery

## 2022-12-31 ENCOUNTER — Encounter: Payer: Self-pay | Admitting: Orthopaedic Surgery

## 2022-12-31 ENCOUNTER — Ambulatory Visit (INDEPENDENT_AMBULATORY_CARE_PROVIDER_SITE_OTHER): Payer: Medicaid Other | Admitting: Orthopaedic Surgery

## 2022-12-31 DIAGNOSIS — G8929 Other chronic pain: Secondary | ICD-10-CM

## 2022-12-31 DIAGNOSIS — M25561 Pain in right knee: Secondary | ICD-10-CM

## 2022-12-31 MED ORDER — BUPIVACAINE HCL 0.5 % IJ SOLN
2.0000 mL | INTRAMUSCULAR | Status: AC | PRN
Start: 2022-12-31 — End: 2022-12-31
  Administered 2022-12-31: 2 mL via INTRA_ARTICULAR

## 2022-12-31 MED ORDER — METHYLPREDNISOLONE ACETATE 40 MG/ML IJ SUSP
40.0000 mg | INTRAMUSCULAR | Status: AC | PRN
Start: 2022-12-31 — End: 2022-12-31
  Administered 2022-12-31: 40 mg via INTRA_ARTICULAR

## 2022-12-31 MED ORDER — LIDOCAINE HCL 1 % IJ SOLN
2.0000 mL | INTRAMUSCULAR | Status: AC | PRN
Start: 2022-12-31 — End: 2022-12-31
  Administered 2022-12-31: 2 mL

## 2022-12-31 NOTE — Progress Notes (Signed)
Office Visit Note   Patient: Lind Barkman           Date of Birth: January 30, 1971           MRN: [id number] Visit Date: 12/31/2022              Requested by: Marguarite Arbour, MD [address] [facility] [place],  [place] [address] PCP: Marguarite Arbour, MD   Assessment & Plan: Visit Diagnoses:  1. Chronic pain of right knee     Plan: Gala Romney is a 52 year old gentleman with chronic right knee pain.  His symptoms seem to stem mostly from bursitis around the ossicle near the tibial tubercle.  We will try cortisone injection today.  He tolerates well.  Limit activity for 6 weeks.  Questions encouraged and answered.  Follow-up as needed.  Follow-Up Instructions: No follow-ups on file.   Orders:  No orders of the defined types were placed in this encounter.  No orders of the defined types were placed in this encounter.     Procedures: Large Joint Inj: R knee on 12/31/2022 11:05 AM Indications: pain Details: 22 G needle  Arthrogram: No  Medications: 40 mg methylPREDNISolone acetate 40 MG/ML; 2 mL lidocaine 1 %; 2 mL bupivacaine 0.5 % Consent was given by the patient. Patient was prepped and draped in the usual sterile fashion.       Clinical Data: No additional findings.   Subjective: Chief Complaint  Patient presents with   Right Knee - Follow-up    MRI review    HPI Brianne is a 52 year old gentleman here to discuss recent right knee MRI scan.  He has been seeing Clerance Lav for this.  Prior to seeing [facility] and he had a 6-week history of anterior knee pain that started after an extended bicycle ride. Review of Systems  Constitutional: Negative.   HENT: Negative.    Eyes: Negative.   Respiratory: Negative.    Cardiovascular: Negative.   Gastrointestinal: Negative.   Endocrine: Negative.   Genitourinary: Negative.   Skin: Negative.   Allergic/Immunologic: Negative.   Neurological: Negative.   Hematological: Negative.    Psychiatric/Behavioral: Negative.    All other systems reviewed and are negative.    Objective: Vital Signs: There were no vitals taken for this visit.  Physical Exam Vitals and nursing note reviewed.  Constitutional:      Appearance: He is well-developed.  HENT:     Head: Normocephalic and atraumatic.  Eyes:     Pupils: Pupils are equal, round, and reactive to light.  Pulmonary:     Effort: Pulmonary effort is normal.  Abdominal:     Palpations: Abdomen is soft.  Musculoskeletal:        General: Normal range of motion.     Cervical back: Neck supple.  Skin:    General: Skin is warm.  Neurological:     Mental Status: He is alert and oriented to person, place, and time.  Psychiatric:        Behavior: Behavior normal.        Thought Content: Thought content normal.        Judgment: Judgment normal.     Ortho Exam Exam of the right knee shows no joint effusion.  Slight tenderness around the tibial tubercle and slight fluctuance. Specialty Comments:  No specialty comments available.  Imaging: No results found.   PMFS History: Patient Active Problem List   Diagnosis Date Noted   FOLLICULITIS 02/10/2010   ECZEMA  01/16/2010   TOBACCO USE, QUIT 01/16/2010   LIVER FUNCTION TESTS, ABNORMAL, HX OF 11/20/2009   ONYCHOMYCOSIS, BILATERAL 11/08/2009   TOBACCO ABUSE 02/04/2009   BIPOLAR DISORDER UNSPECIFIED 01/03/2009   HYPERTENSION 01/03/2009   SKIN RASH 01/03/2009   Past Medical History:  Diagnosis Date   Bipolar disorder, unspecified (HCC)    Hypertension     Family History  Problem Relation Age of Onset   Diabetes Other        fam hx 1st degree relative   Hypertension Other    Prostate cancer Other    Cancer Other        prostate 1st degree relative    History reviewed. No pertinent surgical history. Social History   Occupational History   Not on file  Tobacco Use   Smoking status: Former   Smokeless tobacco: Never  Substance and Sexual Activity    Alcohol use: Yes   Drug use: Never   Sexual activity: Not on file

## 2023-01-15 DIAGNOSIS — N1831 Chronic kidney disease, stage 3a: Secondary | ICD-10-CM | POA: Diagnosis not present

## 2023-01-15 DIAGNOSIS — E782 Mixed hyperlipidemia: Secondary | ICD-10-CM | POA: Diagnosis not present

## 2023-01-15 DIAGNOSIS — R7989 Other specified abnormal findings of blood chemistry: Secondary | ICD-10-CM | POA: Diagnosis not present

## 2023-01-15 DIAGNOSIS — Z23 Encounter for immunization: Secondary | ICD-10-CM | POA: Diagnosis not present

## 2023-01-15 DIAGNOSIS — F330 Major depressive disorder, recurrent, mild: Secondary | ICD-10-CM | POA: Diagnosis not present

## 2023-01-15 DIAGNOSIS — I10 Essential (primary) hypertension: Secondary | ICD-10-CM | POA: Diagnosis not present

## 2023-01-20 ENCOUNTER — Ambulatory Visit (INDEPENDENT_AMBULATORY_CARE_PROVIDER_SITE_OTHER): Payer: Medicaid Other | Admitting: Orthopaedic Surgery

## 2023-01-20 ENCOUNTER — Encounter: Payer: Self-pay | Admitting: Orthopaedic Surgery

## 2023-01-20 DIAGNOSIS — M7650 Patellar tendinitis, unspecified knee: Secondary | ICD-10-CM

## 2023-01-20 DIAGNOSIS — M7651 Patellar tendinitis, right knee: Secondary | ICD-10-CM | POA: Diagnosis not present

## 2023-01-20 DIAGNOSIS — M2341 Loose body in knee, right knee: Secondary | ICD-10-CM

## 2023-01-20 NOTE — Progress Notes (Signed)
Office Visit Note   Patient: Devon Hayden           Date of Birth: 1970/06/02           MRN: [id number] Visit Date: 01/20/2023              Requested by: Marguarite Arbour, MD [address] [facility] [place],  [place] [address] PCP: Marguarite Arbour, MD   Assessment & Plan: Visit Diagnoses:  1. Loose body in knee, right knee   2. Patellar tendinosis     Plan: Mr. 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  The cortisone injection provided 2 weeks of relief.  At this point he has elected to move forward with surgical treatment with this condition.  I would like to hold off on surgery for another month to allow adequate time from the cortisone injection.  We will provide him with a Bledsoe brace today that he will need postoperatively.  Follow-Up Instructions: No follow-ups on file.   Orders:  No orders of the defined types were placed in this encounter.  No orders of the defined types were placed in this encounter.     Procedures: No procedures performed   Clinical Data: No additional findings.   Subjective: Chief Complaint  Patient presents with   Right Knee - Follow-up    HPI [name] returns today for continued right knee pain.  Cortisone injection provided 2 weeks of relief. Review of Systems  Constitutional: Negative.   HENT: Negative.    Eyes: Negative.   Respiratory: Negative.    Cardiovascular: Negative.   Gastrointestinal: Negative.   Endocrine: Negative.   Genitourinary: Negative.   Skin: Negative.   Allergic/Immunologic: Negative.   Neurological: Negative.   Hematological: Negative.   Psychiatric/Behavioral: Negative.    All other systems reviewed and are negative.    Objective: Vital Signs: There were no vitals taken for this visit.  Physical Exam Vitals and nursing note reviewed.  Constitutional:      Appearance: He is  well-developed.  Pulmonary:     Effort: Pulmonary effort is normal.  Abdominal:     Palpations: Abdomen is soft.  Skin:    General: Skin is warm.  Neurological:     Mental Status: He is alert and oriented to person, place, and time.  Psychiatric:        Behavior: Behavior normal.        Thought Content: Thought content normal.        Judgment: Judgment normal.     Ortho Exam Exam of the right knee is unchanged.  He has tenderness to the tibial tubercle.  He has slight fluctuance around the tibial tubercle. Specialty Comments:  No specialty comments available.  Imaging: No results found.   PMFS History: Patient Active Problem List   Diagnosis Date Noted   FOLLICULITIS 02/10/2010   ECZEMA 01/16/2010   TOBACCO USE, QUIT 01/16/2010   LIVER FUNCTION TESTS, ABNORMAL, HX OF 11/20/2009   ONYCHOMYCOSIS, BILATERAL 11/08/2009   TOBACCO ABUSE 02/04/2009   BIPOLAR DISORDER UNSPECIFIED 01/03/2009   Essential hypertension 01/03/2009   SKIN RASH 01/03/2009   Past Medical History:  Diagnosis Date   Bipolar disorder, unspecified (HCC)    Hypertension     Family History  Problem Relation Age of Onset   Diabetes Other        fam hx 1st degree relative   Hypertension Other    Prostate  cancer Other    Cancer Other        prostate 1st degree relative    History reviewed. No pertinent surgical history. Social History   Occupational History   Not on file  Tobacco Use   Smoking status: Former   Smokeless tobacco: Never  Substance and Sexual Activity   Alcohol use: Yes   Drug use: Never   Sexual activity: Not on file

## 2023-02-02 DIAGNOSIS — F4310 Post-traumatic stress disorder, unspecified: Secondary | ICD-10-CM | POA: Diagnosis not present

## 2023-02-02 DIAGNOSIS — F331 Major depressive disorder, recurrent, moderate: Secondary | ICD-10-CM | POA: Diagnosis not present

## 2023-02-02 DIAGNOSIS — F411 Generalized anxiety disorder: Secondary | ICD-10-CM | POA: Diagnosis not present

## 2023-02-12 ENCOUNTER — Other Ambulatory Visit: Payer: Self-pay | Admitting: Physician Assistant

## 2023-02-12 ENCOUNTER — Other Ambulatory Visit (HOSPITAL_COMMUNITY): Payer: Self-pay

## 2023-02-12 MED ORDER — ONDANSETRON HCL 4 MG PO TABS
4.0000 mg | ORAL_TABLET | Freq: Three times a day (TID) | ORAL | 0 refills | Status: DC | PRN
  Filled 2023-02-12: qty 40, 14d supply, fill #0

## 2023-02-12 MED ORDER — METHOCARBAMOL 750 MG PO TABS
750.0000 mg | ORAL_TABLET | Freq: Two times a day (BID) | ORAL | 0 refills | Status: DC | PRN
  Filled 2023-02-12: qty 20, 10d supply, fill #0

## 2023-02-12 MED ORDER — HYDROCODONE-ACETAMINOPHEN 5-325 MG PO TABS
1.0000 | ORAL_TABLET | Freq: Three times a day (TID) | ORAL | 0 refills | Status: AC | PRN
  Filled 2023-02-12: qty 21, 7d supply, fill #0

## 2023-02-16 ENCOUNTER — Encounter (HOSPITAL_BASED_OUTPATIENT_CLINIC_OR_DEPARTMENT_OTHER)
Admission: RE | Admit: 2023-02-16 | Discharge: 2023-02-16 | Disposition: A | Discharge status: Home / Self Care | Payer: Medicaid Other | Source: Ambulatory Visit | Attending: Orthopaedic Surgery | Admitting: Orthopaedic Surgery

## 2023-02-16 DIAGNOSIS — Z01810 Encounter for preprocedural cardiovascular examination: Secondary | ICD-10-CM | POA: Insufficient documentation

## 2023-02-16 NOTE — Progress Notes (Signed)

## 2023-02-17 ENCOUNTER — Other Ambulatory Visit: Payer: Self-pay

## 2023-02-17 ENCOUNTER — Ambulatory Visit (HOSPITAL_BASED_OUTPATIENT_CLINIC_OR_DEPARTMENT_OTHER)
Admission: RE | Admit: 2023-02-17 | Discharge: 2023-02-17 | Disposition: A | Discharge status: Home / Self Care | Payer: Medicaid Other | Attending: Orthopaedic Surgery | Admitting: Orthopaedic Surgery

## 2023-02-17 ENCOUNTER — Ambulatory Visit (HOSPITAL_BASED_OUTPATIENT_CLINIC_OR_DEPARTMENT_OTHER): Payer: Medicaid Other | Admitting: Anesthesiology

## 2023-02-17 ENCOUNTER — Encounter (HOSPITAL_BASED_OUTPATIENT_CLINIC_OR_DEPARTMENT_OTHER)
Admission: RE | Disposition: A | Discharge status: Home / Self Care | Payer: Self-pay | Source: Home / Self Care | Attending: Orthopaedic Surgery

## 2023-02-17 ENCOUNTER — Encounter (HOSPITAL_BASED_OUTPATIENT_CLINIC_OR_DEPARTMENT_OTHER): Payer: Self-pay | Admitting: Orthopaedic Surgery

## 2023-02-17 DIAGNOSIS — Z87891 Personal history of nicotine dependence: Secondary | ICD-10-CM | POA: Diagnosis not present

## 2023-02-17 DIAGNOSIS — M67969 Unspecified disorder of synovium and tendon, unspecified lower leg: Secondary | ICD-10-CM | POA: Insufficient documentation

## 2023-02-17 DIAGNOSIS — M2341 Loose body in knee, right knee: Secondary | ICD-10-CM | POA: Diagnosis not present

## 2023-02-17 DIAGNOSIS — M76891 Other specified enthesopathies of right lower limb, excluding foot: Secondary | ICD-10-CM | POA: Diagnosis present

## 2023-02-17 DIAGNOSIS — M7651 Patellar tendinitis, right knee: Secondary | ICD-10-CM | POA: Diagnosis not present

## 2023-02-17 DIAGNOSIS — M67961 Unspecified disorder of synovium and tendon, right lower leg: Secondary | ICD-10-CM | POA: Diagnosis not present

## 2023-02-17 DIAGNOSIS — Z01818 Encounter for other preprocedural examination: Secondary | ICD-10-CM

## 2023-02-17 HISTORY — PX: REPAIR OF RUPTURED PATELLA LIGAMENT: SHX6066

## 2023-02-17 SURGERY — REPAIR OF RUPTURED PATELLA LIGAMENT
Anesthesia: General | Site: Knee | Laterality: Right

## 2023-02-17 MED ORDER — ONDANSETRON HCL 4 MG/2ML IJ SOLN: 4.0000 mg | Freq: Once | INTRAMUSCULAR | Status: DC | PRN

## 2023-02-17 MED ORDER — MIDAZOLAM HCL 2 MG/2ML IJ SOLN
INTRAMUSCULAR | Status: AC
Start: 2023-02-17 — End: ?
  Filled 2023-02-17: qty 2

## 2023-02-17 MED ORDER — OXYCODONE HCL 5 MG/5ML PO SOLN: 5.0000 mg | Freq: Once | ORAL | Status: DC | PRN

## 2023-02-17 MED ORDER — PROPOFOL 10 MG/ML IV BOLUS
INTRAVENOUS | Status: DC | PRN
  Administered 2023-02-17: 200 mg via INTRAVENOUS

## 2023-02-17 MED ORDER — ACETAMINOPHEN 10 MG/ML IV SOLN: 1000.0000 mg | Freq: Once | INTRAVENOUS | Status: DC | PRN

## 2023-02-17 MED ORDER — CEFAZOLIN SODIUM-DEXTROSE 2-4 GM/100ML-% IV SOLN
2.0000 g | INTRAVENOUS | Status: AC
  Administered 2023-02-17: 2 g via INTRAVENOUS

## 2023-02-17 MED ORDER — LACTATED RINGERS IV SOLN: INTRAVENOUS | Status: DC

## 2023-02-17 MED ORDER — SODIUM CHLORIDE 0.9 % IV SOLN: INTRAVENOUS | Status: DC | PRN

## 2023-02-17 MED ORDER — 0.9 % SODIUM CHLORIDE (POUR BTL) OPTIME
TOPICAL | Status: DC | PRN
  Administered 2023-02-17: 100 mL

## 2023-02-17 MED ORDER — OXYCODONE HCL 5 MG PO TABS: 5.0000 mg | ORAL_TABLET | Freq: Once | ORAL | Status: DC | PRN

## 2023-02-17 MED ORDER — FENTANYL CITRATE (PF) 100 MCG/2ML IJ SOLN
25.0000 ug | INTRAMUSCULAR | Status: DC | PRN
  Administered 2023-02-17: 50 ug via INTRAVENOUS

## 2023-02-17 MED ORDER — DEXAMETHASONE SODIUM PHOSPHATE 10 MG/ML IJ SOLN
INTRAMUSCULAR | Status: DC | PRN
  Administered 2023-02-17: 10 mg via INTRAVENOUS

## 2023-02-17 MED ORDER — MIDAZOLAM HCL 5 MG/5ML IJ SOLN
INTRAMUSCULAR | Status: DC | PRN
  Administered 2023-02-17: 2 mg via INTRAVENOUS

## 2023-02-17 MED ORDER — BUPIVACAINE HCL (PF) 0.5 % IJ SOLN
INTRAMUSCULAR | Status: DC | PRN
  Administered 2023-02-17: 20 mL

## 2023-02-17 MED ORDER — FENTANYL CITRATE (PF) 100 MCG/2ML IJ SOLN
INTRAMUSCULAR | Status: AC
  Filled 2023-02-17: qty 2

## 2023-02-17 MED ORDER — ONDANSETRON HCL 4 MG/2ML IJ SOLN
INTRAMUSCULAR | Status: AC
  Filled 2023-02-17: qty 2

## 2023-02-17 MED ORDER — FENTANYL CITRATE (PF) 100 MCG/2ML IJ SOLN
INTRAMUSCULAR | Status: DC | PRN
  Administered 2023-02-17 (×2): 50 ug via INTRAVENOUS

## 2023-02-17 MED ORDER — PROPOFOL 10 MG/ML IV BOLUS
INTRAVENOUS | Status: AC
  Filled 2023-02-17: qty 20

## 2023-02-17 MED ORDER — DEXAMETHASONE SODIUM PHOSPHATE 10 MG/ML IJ SOLN
INTRAMUSCULAR | Status: AC
  Filled 2023-02-17: qty 1

## 2023-02-17 MED ORDER — MIDAZOLAM HCL 2 MG/2ML IJ SOLN
INTRAMUSCULAR | Status: AC
  Filled 2023-02-17: qty 2

## 2023-02-17 MED ORDER — ONDANSETRON HCL 4 MG/2ML IJ SOLN
INTRAMUSCULAR | Status: DC | PRN
  Administered 2023-02-17: 4 mg via INTRAVENOUS

## 2023-02-17 MED ORDER — LIDOCAINE 2% (20 MG/ML) 5 ML SYRINGE
INTRAMUSCULAR | Status: AC
  Filled 2023-02-17: qty 5

## 2023-02-17 MED ORDER — LIDOCAINE HCL (CARDIAC) PF 100 MG/5ML IV SOSY
PREFILLED_SYRINGE | INTRAVENOUS | Status: DC | PRN
  Administered 2023-02-17: 100 mg via INTRAVENOUS

## 2023-02-17 MED ORDER — CEFAZOLIN SODIUM-DEXTROSE 2-4 GM/100ML-% IV SOLN
INTRAVENOUS | Status: AC
  Filled 2023-02-17: qty 100

## 2023-02-17 SURGICAL SUPPLY — 73 items
ANCH FIBERTAK DL SP W/NDL 2.6 (Anchor) ×1 IMPLANT
ANCH SUT FBRTK 2 LD NDL (Anchor) ×1 IMPLANT
ANCHOR FIBRTK DL SP W/NDL 2.6 (Anchor) IMPLANT
BANDAGE ESMARK 6X9 LF (GAUZE/BANDAGES/DRESSINGS) ×1 IMPLANT
BLADE HEX COATED 2.75 (ELECTRODE) ×1 IMPLANT
BLADE SURG 15 STRL LF DISP TIS (BLADE) ×2 IMPLANT
BLADE SURG 15 STRL SS (BLADE) ×2
BNDG CMPR 5X4 KNIT ELC UNQ LF (GAUZE/BANDAGES/DRESSINGS)
BNDG CMPR 6 X 5 YARDS HK CLSR (GAUZE/BANDAGES/DRESSINGS) ×1
BNDG CMPR 9X6 STRL LF SNTH (GAUZE/BANDAGES/DRESSINGS) ×1
BNDG ELASTIC 4INX 5YD STR LF (GAUZE/BANDAGES/DRESSINGS) IMPLANT
BNDG ELASTIC 6INX 5YD STR LF (GAUZE/BANDAGES/DRESSINGS) ×1 IMPLANT
BNDG ESMARK 6X9 LF (GAUZE/BANDAGES/DRESSINGS) ×1
COOLER ICEMAN CLASSIC (MISCELLANEOUS) IMPLANT
CUFF TOURN SGL QUICK 24 (TOURNIQUET CUFF)
CUFF TOURN SGL QUICK 34 (TOURNIQUET CUFF) ×1
CUFF TRNQT CYL 24X4X16.5-23 (TOURNIQUET CUFF) IMPLANT
CUFF TRNQT CYL 34X4.125X (TOURNIQUET CUFF) IMPLANT
DRAPE EXTREMITY T 121X128X90 (DISPOSABLE) ×1 IMPLANT
DRAPE U-SHAPE 47X51 STRL (DRAPES) IMPLANT
DRESSING MEPILEX FLEX 4X4 (GAUZE/BANDAGES/DRESSINGS) IMPLANT
DRSG MEPILEX FLEX 4X4 (GAUZE/BANDAGES/DRESSINGS) ×1
DURAPREP 26ML APPLICATOR (WOUND CARE) ×1 IMPLANT
ELECT REM PT RETURN 9FT ADLT (ELECTROSURGICAL) ×1
ELECTRODE REM PT RTRN 9FT ADLT (ELECTROSURGICAL) ×1 IMPLANT
GAUZE SPONGE 4X4 12PLY STRL (GAUZE/BANDAGES/DRESSINGS) ×1 IMPLANT
GAUZE XEROFORM 1X8 LF (GAUZE/BANDAGES/DRESSINGS) ×1 IMPLANT
GLOVE BIOGEL PI IND STRL 7.5 (GLOVE) ×1 IMPLANT
GLOVE ECLIPSE 7.0 STRL STRAW (GLOVE) ×1 IMPLANT
GLOVE INDICATOR 7.0 STRL GRN (GLOVE) ×1 IMPLANT
GLOVE SURG SYN 7.5 E (GLOVE) ×2
GLOVE SURG SYN 7.5 PF PI (GLOVE) ×1 IMPLANT
GOWN STRL REUS W/ TWL LRG LVL3 (GOWN DISPOSABLE) ×1 IMPLANT
GOWN STRL REUS W/ TWL XL LVL3 (GOWN DISPOSABLE) ×1 IMPLANT
GOWN STRL REUS W/TWL LRG LVL3 (GOWN DISPOSABLE) ×2
GOWN STRL REUS W/TWL XL LVL3 (GOWN DISPOSABLE) ×1
GOWN STRL SURGICAL XL XLNG (GOWN DISPOSABLE) ×1 IMPLANT
IMMOBILIZER KNEE 22 UNIV (SOFTGOODS) IMPLANT
IMMOBILIZER KNEE 24 THIGH 36 (MISCELLANEOUS) IMPLANT
IMMOBILIZER KNEE 24 UNIV (MISCELLANEOUS)
KIT KNEE FIBERTAK DISP (KITS) IMPLANT
MANIFOLD NEPTUNE II (INSTRUMENTS) IMPLANT
NS IRRIG 1000ML POUR BTL (IV SOLUTION) ×3 IMPLANT
PACK ARTHROSCOPY DSU (CUSTOM PROCEDURE TRAY) ×1 IMPLANT
PACK BASIN DAY SURGERY FS (CUSTOM PROCEDURE TRAY) ×1 IMPLANT
PAD CAST 4YDX4 CTTN HI CHSV (CAST SUPPLIES) IMPLANT
PAD COLD SHLDR WRAP-ON (PAD) IMPLANT
PADDING CAST COTTON 4X4 STRL (CAST SUPPLIES)
PADDING CAST COTTON 6X4 STRL (CAST SUPPLIES) IMPLANT
PENCIL SMOKE EVACUATOR (MISCELLANEOUS) ×1 IMPLANT
RETRIEVER SUT HEWSON (MISCELLANEOUS) ×1 IMPLANT
SHEET MEDIUM DRAPE 40X70 STRL (DRAPES) ×1 IMPLANT
SLEEVE SCD COMPRESS KNEE MED (STOCKING) IMPLANT
SPIKE FLUID TRANSFER (MISCELLANEOUS) IMPLANT
SPONGE T-LAP 18X18 ~~LOC~~+RFID (SPONGE) IMPLANT
STAPLER VISISTAT (STAPLE) IMPLANT
SUCTION TUBE FRAZIER 10FR DISP (SUCTIONS) ×1 IMPLANT
SUT ETHILON 3 0 PS 1 (SUTURE) IMPLANT
SUT FIBERWIRE #2 38 T-5 BLUE (SUTURE)
SUT VIC AB 0 CT1 18XCR BRD 8 (SUTURE) ×1 IMPLANT
SUT VIC AB 0 CT1 27 (SUTURE) ×1
SUT VIC AB 0 CT1 27XBRD ANBCTR (SUTURE) IMPLANT
SUT VIC AB 0 CT1 8-18 (SUTURE) ×1
SUT VIC AB 2-0 CT1 27 (SUTURE)
SUT VIC AB 2-0 CT1 TAPERPNT 27 (SUTURE) IMPLANT
SUT VIC AB 2-0 SH 27 (SUTURE) ×1
SUT VIC AB 2-0 SH 27XBRD (SUTURE) ×1 IMPLANT
SUT VIC AB CT1 27XBRD ANBCTRL (SUTURE) ×1
SUTURE FIBERWR #2 38 T-5 BLUE (SUTURE) IMPLANT
TOWEL GREEN STERILE FF (TOWEL DISPOSABLE) ×2 IMPLANT
TUBE CONNECTING 20X1/4 (TUBING) ×1 IMPLANT
UNDERPAD 30X36 HEAVY ABSORB (UNDERPADS AND DIAPERS) ×1 IMPLANT
YANKAUER SUCT BULB TIP NO VENT (SUCTIONS) IMPLANT

## 2023-02-17 NOTE — Anesthesia Procedure Notes (Signed)
Date/Time: 02/17/2023 12:10 PM  Performed by: Thornell Mule, CRNAPre-anesthesia Checklist: Patient identified, Emergency Drugs available, Patient being monitored, Suction available and Timeout performed Patient Re-evaluated:Patient Re-evaluated prior to induction Oxygen Delivery Method: Circle system utilized Preoxygenation: Pre-oxygenation with 100% oxygen Induction Type: IV induction LMA: LMA inserted LMA Size: 4.0 Number of attempts: 1 Placement Confirmation: positive ETCO2 Tube secured with: Tape Dental Injury: Teeth and Oropharynx as per pre-operative assessment

## 2023-02-17 NOTE — Discharge Instructions (Addendum)
Postoperative instructions:  Weightbearing instructions: as tolerated in the brace  Keep your dressing and/or splint clean and dry at all times.  You can remove your dressing on post-operative day #3 and change with a dry/sterile dressing or Band-Aids as needed thereafter.    Incision instructions:  Do not soak your incision for 3 weeks after surgery.  If the incision gets wet, pat dry and do not scrub the incision.  Pain control:  You have been given a prescription to be taken as directed for post-operative pain control.  In addition, elevate the operative extremity above the heart at all times to prevent swelling and throbbing pain.  Take over-the-counter Colace, 100mg  by mouth twice a day while taking narcotic pain medications to help prevent constipation.  Follow up appointments: 1) 14 days for suture removal and wound check. 2) Dr. Roda Shutters as scheduled.   -------------------------------------------------------------------------------------------------------------  After Surgery Pain Control:  After your surgery, post-surgical discomfort or pain is likely. This discomfort can last several days to a few weeks. At certain times of the day your discomfort may be more intense.  Did you receive a nerve block?  A nerve block can provide pain relief for one hour to two days after your surgery. As long as the nerve block is working, you will experience little or no sensation in the area the surgeon operated on.  As the nerve block wears off, you will begin to experience pain or discomfort. It is very important that you begin taking your prescribed pain medication before the nerve block fully wears off. Treating your pain at the first sign of the block wearing off will ensure your pain is better controlled and more tolerable when full-sensation returns. Do not wait until the pain is intolerable, as the medicine will be less effective. It is better to treat pain in advance than to try and catch up.   General Anesthesia:  If you did not receive a nerve block during your surgery, you will need to start taking your pain medication shortly after your surgery and should continue to do so as prescribed by your surgeon.  Pain Medication:  Most commonly we prescribe Vicodin and Percocet for post-operative pain. Both of these medications contain a combination of acetaminophen (Tylenol) and a narcotic to help control pain.   It takes between 30 and 45 minutes before pain medication starts to work. It is important to take your medication before your pain level gets too intense.   Nausea is a common side effect of many pain medications. You will want to eat something before taking your pain medicine to help prevent nausea.   If you are taking a prescription pain medication that contains acetaminophen, we recommend that you do not take additional over the counter acetaminophen (Tylenol).  Other pain relieving options:   Using a cold pack to ice the affected area a few times a day (15 to 20 minutes at a time) can help to relieve pain, reduce swelling and bruising.   Elevation of the affected area can also help to reduce pain and swelling.  Per [facility] clinic policy, our goal is ensure optimal postoperative pain control with a multimodal pain management strategy. For all [facility] patients, our goal is to wean post-operative narcotic medications by 6 weeks post-operatively. If this is not possible due to utilization of pain medication prior to surgery, your [facility] doctor will support your acute post-operative pain control for the first 6 weeks postoperatively, with a plan to transition you back  to your primary pain team following that. Cyndia Skeeters will work to ensure a Therapist, occupational.   Post Anesthesia Home Care Instructions  Activity: Get plenty of rest for the remainder of the day. A responsible individual must stay with you for 24 hours following the procedure.  For the next 24 hours, DO  NOT: -Drive a car -Advertising copywriter -Drink alcoholic beverages -Take any medication unless instructed by your physician -Make any legal decisions or sign important papers.  Meals: Start with liquid foods such as gelatin or soup. Progress to regular foods as tolerated. Avoid greasy, spicy, heavy foods. If nausea and/or vomiting occur, drink only clear liquids until the nausea and/or vomiting subsides. Call your physician if vomiting continues.  Special Instructions/Symptoms: Your throat may feel dry or sore from the anesthesia or the breathing tube placed in your throat during surgery. If this causes discomfort, gargle with warm salt water. The discomfort should disappear within 24 hours.  If you had a scopolamine patch placed behind your ear for the management of post- operative nausea and/or vomiting:  1. The medication in the patch is effective for 72 hours, after which it should be removed.  Wrap patch in a tissue and discard in the trash. Wash hands thoroughly with soap and water. 2. You may remove the patch earlier than 72 hours if you experience unpleasant side effects which may include dry mouth, dizziness or visual disturbances. 3. Avoid touching the patch. Wash your hands with soap and water after contact with the patch.

## 2023-02-17 NOTE — Transfer of Care (Signed)
Immediate Anesthesia Transfer of Care Note  Patient: Devon Hayden  Procedure(s) Performed: RIGHT KNEE PATELLAR TENDON REPAIR, LOOSE BODY REMOVAL (Right: Knee)  Patient Location: PACU  Anesthesia Type:General and Regional  Level of Consciousness: awake, alert , and oriented  Airway & Oxygen Therapy: Patient Spontanous Breathing and Patient connected to face mask oxygen  Post-op Assessment: Report given to RN and Post -op Vital signs reviewed and stable  Post vital signs: Reviewed and stable  Last Vitals:  Vitals Value Taken Time  BP 116/81 02/17/23 1316  Temp    Pulse 80 02/17/23 1317  Resp 13 02/17/23 1317  SpO2 99 % 02/17/23 1317  Vitals shown include unfiled device data.  Last Pain:  Vitals:   02/17/23 1020  TempSrc: Temporal  PainSc: 0-No pain         Complications: No notable events documented.

## 2023-02-17 NOTE — Anesthesia Postprocedure Evaluation (Signed)
Anesthesia Post Note  Patient: Daeveon Zweber  Procedure(s) Performed: RIGHT KNEE PATELLAR TENDON REPAIR, LOOSE BODY REMOVAL (Right: Knee)     Patient location during evaluation: PACU Anesthesia Type: General Level of consciousness: awake and alert Pain management: pain level controlled Vital Signs Assessment: post-procedure vital signs reviewed and stable Respiratory status: spontaneous breathing, nonlabored ventilation, respiratory function stable and patient connected to nasal cannula oxygen Cardiovascular status: blood pressure returned to baseline and stable Postop Assessment: no apparent nausea or vomiting Anesthetic complications: no  No notable events documented.  Last Vitals:  Vitals:   02/17/23 1354 02/17/23 1405  BP: 127/84 (!) 136/96  Pulse: 65 88  Resp: 13 18  Temp:  36.6 C  SpO2: 95% 97%    Last Pain:  Vitals:   02/17/23 1405  TempSrc:   PainSc: 2                  Shelton Silvas

## 2023-02-17 NOTE — Op Note (Addendum)
   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  The patient did consent to the procedure after discussion of the risks and benefits.  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  PROCEDURE:  Repair of chronic right distal patellar tendinopathy Excision of ossicle from right tibial tubercle  SURGEON: N. Glee Arvin, M.D.  ASSIST: Starlyn Skeans [place], [place]; necessary for the timely completion of procedure and due to complexity of procedure.  ANESTHESIA:  general  IV FLUIDS AND URINE: See anesthesia.  ESTIMATED BLOOD LOSS: minimal  IMPLANTS:  Implant Name Type Inv. Item Serial No. Manufacturer Lot No. LRB No. Used Action  ANCH SUT FBRTK 2 LD NDL - [id number] Anchor ANCH SUT FBRTK 2 LD NDL  [employer] [id number] Right 1 Implanted    DRAINS: None  COMPLICATIONS: see description of procedure.  DESCRIPTION OF PROCEDURE: The patient was brought to the operating room.  The patient had been signed prior to the procedure and this was documented. The patient had the anesthesia placed by the anesthesiologist.  A time-out was performed to confirm that this was the correct patient, site, side and location. The patient did receive antibiotics prior to the incision and was re-dosed during the procedure as needed at indicated intervals.  A tourniquet was placed.  The patient had the operative extremity prepped and draped in the standard surgical fashion.    The borders of the patella tendon were palpated and marked on the skin.  An incision was created over the middle of the patella tendon towards the distal half.  Full-thickness flaps were raised.  The peritenon was elevated off of the underlying patellar tendon.  The patella tendon was then split in line with its fibers sharply with a 15 blade.  There was  chronically inflamed tissue consistent with the preoperative MRI.  There was bursitis and chronic tendinosis.  The bursitis and the tendinosis was excised sharply with a rondure and 15 blade.  The loose body was immediately deep to the patellar tendon which was sharply excised from the surrounding soft tissues and the underlying proximal tibia.  The loose body measured approximately a centimeter and a half in diameter.  The surgical site was then thoroughly irrigated.  The anterior cortex of the proximal tibia was excoriated down to bleeding bone with a rongeur.  An all suture anchor was placed in the anterior proximal tibia to repair the patellar tendon.  4 strands of sutures were used to repair the tendon back down to the bone.  Sutures were cut short and surgical site was thoroughly irrigated and closed in a layered fashion with 2-0 Vicryl and a running 3-0 Monocryl for the skin.  Steri-Strips were applied.  Sterile dressing was applied.  Bledsoe brace was placed.  Patient tolerated procedure well had no immediate complications.  Devon Hayden was necessary for opening, closing, retracting, limb positioning and overall facilitation and timely completion of the procedure.  POSTOPERATIVE PLAN: Patient will be discharged home.  Weight-bear as tolerated in Bledsoe brace.  Follow-up in 2 weeks for recheck.  Devon Reel, MD 12:45 PM

## 2023-02-17 NOTE — H&P (Signed)
PREOPERATIVE H&P  Chief Complaint: right knee insertional tendinopathy, loose body  HPI: Devon Hayden is a 52 y.o. male who presents for surgical treatment of right knee insertional tendinopathy, loose body.  He denies any changes in medical history.  History reviewed. No pertinent surgical history. Social History   Socioeconomic History   Marital status: Single    Spouse name: Not on file   Number of children: Not on file   Years of education: Not on file   Highest education level: Not on file  Occupational History   Not on file  Tobacco Use   Smoking status: Former   Smokeless tobacco: Never  Substance and Sexual Activity   Alcohol use: Yes   Drug use: Never   Sexual activity: Not on file  Other Topics Concern   Not on file  Social History Narrative   No reg exercise   Social Determinants of Health   Financial Resource Strain: Low Risk  (09/15/2022)   Received from [facility]   Overall Financial Resource Strain (CARDIA)    Difficulty of Paying Living Expenses: Not very hard  Food Insecurity: No Food Insecurity (09/15/2022)   Received from [facility]   Hunger Vital Sign    Worried About Running Out of Food in the Last Year: Never true    Ran Out of Food in the Last Year: Never true  Transportation Needs: No Transportation Needs (09/15/2022)   Received from [facility] - Transportation    Lack of Transportation (Medical): No    Lack of Transportation (Non-Medical): No  Physical Activity: Unknown (09/15/2022)   Received from [facility]   Exercise Vital Sign    Days of Exercise per Week: 0 days    Minutes of Exercise per Session: Not on file  Stress: No Stress Concern Present (09/15/2022)   Received from [facility] of Occupational [facility] - Occupational Stress Questionnaire    Feeling of Stress : Not at all  Social Connections: Socially Integrated (09/15/2022)   Received from [facility]   Social Network     How would you rate your social network (family, work, friends)?: Good participation with social networks   Family History  Problem Relation Age of Onset   Diabetes Other        fam hx 1st degree relative   Hypertension Other    Prostate cancer Other    Cancer Other        prostate 1st degree relative   No Known Allergies Prior to Admission medications   Medication Sig Start Date End Date Taking? Authorizing Provider  amLODipine-benazepril (LOTREL) 5-20 MG capsule Take by mouth. 06/22/18 02/17/23 Yes [provider]  buPROPion (WELLBUTRIN XL) 300 MG 24 hr tablet Take 300 mg by mouth daily. 09/15/19  Yes [provider]  testosterone cypionate (DEPO-TESTOSTERONE) 200 MG/ML injection Inject 200 mg into the muscle every Saturday. 10/18/17  Yes [provider]  HYDROcodone-acetaminophen (NORCO) 5-325 MG tablet Take 1 tablet by mouth 3 (three) times daily as needed. 02/12/23   Cristie Hem, PA-C  methocarbamol (ROBAXIN-750) 750 MG tablet Take 1 tablet (750 mg total) by mouth 2 (two) times daily as needed for muscle spasms. 02/12/23   Cristie Hem, PA-C  ondansetron (ZOFRAN) 4 MG tablet Take 1 tablet (4 mg total) by mouth every 8 (eight) hours as needed for nausea or vomiting. 02/12/23   Cristie Hem, PA-C  tadalafil (CIALIS) 5 MG tablet TAKE  1 TABLET (5 MG TOTAL) BY MOUTH ONCE DAILY 05/17/20 06/01/21  Marguarite Arbour, MD     Positive ROS: All other systems have been reviewed and were otherwise negative with the exception of those mentioned in the HPI and as above.  Physical Exam: General: Alert, no acute distress Cardiovascular: No pedal edema Respiratory: No cyanosis, no use of accessory musculature GI: abdomen soft Skin: No lesions in the area of chief complaint Neurologic: Sensation intact distally Psychiatric: Patient is competent for consent with normal mood and affect Lymphatic: no lymphedema  MUSCULOSKELETAL: exam stable  Assessment: right knee  insertional tendinopathy, loose body  Plan: Plan for Procedure(s): RIGHT KNEE PATELLAR TENDON REPAIR, LOOSE BODY REMOVAL  The risks benefits and alternatives were discussed with the patient including but not limited to the risks of nonoperative treatment, versus surgical intervention including infection, bleeding, nerve injury,  blood clots, cardiopulmonary complications, morbidity, mortality, among others, and they were willing to proceed.   Glee Arvin, MD 02/17/2023 10:33 AM

## 2023-02-17 NOTE — Anesthesia Preprocedure Evaluation (Addendum)
Anesthesia Evaluation  Patient identified by MRN, date of birth, ID band Patient awake    Reviewed: Allergy & Precautions, NPO status , Patient's Chart, lab work & pertinent test results, reviewed documented beta blocker date and time   History of Anesthesia Complications Negative for: history of anesthetic complications  Airway Mallampati: III  TM Distance: >3 FB     Dental no notable dental hx.    Pulmonary neg COPD, former smoker   breath sounds clear to auscultation       Cardiovascular Exercise Tolerance: Good hypertension, Pt. on medications (-) angina (-) CAD, (-) Past MI, (-) Cardiac Stents and (-) CABG  Rhythm:Regular Rate:Normal     Neuro/Psych neg Seizures PSYCHIATRIC DISORDERS   Bipolar Disorder      GI/Hepatic ,neg GERD  ,,(+) neg Cirrhosis        Endo/Other  neg diabetes    Renal/GU Renal disease     Musculoskeletal   Abdominal   Peds  Hematology   Anesthesia Other Findings   Reproductive/Obstetrics                             Anesthesia Physical Anesthesia Plan  ASA: 2  Anesthesia Plan: General   Post-op Pain Management:    Induction: Intravenous  PONV Risk Score and Plan: 2 and Ondansetron and Dexamethasone  Airway Management Planned: LMA  Additional Equipment:   Intra-op Plan:   Post-operative Plan: Extubation in OR  Informed Consent: I have reviewed the patients History and Physical, chart, labs and discussed the procedure including the risks, benefits and alternatives for the proposed anesthesia with the patient or authorized representative who has indicated his/her understanding and acceptance.     Dental advisory given  Plan Discussed with: CRNA  Anesthesia Plan Comments:        Anesthesia Quick Evaluation

## 2023-02-18 ENCOUNTER — Encounter (HOSPITAL_BASED_OUTPATIENT_CLINIC_OR_DEPARTMENT_OTHER): Payer: Self-pay | Admitting: Orthopaedic Surgery

## 2023-02-24 ENCOUNTER — Ambulatory Visit (INDEPENDENT_AMBULATORY_CARE_PROVIDER_SITE_OTHER): Payer: Medicaid Other | Admitting: Physician Assistant

## 2023-02-24 DIAGNOSIS — M7650 Patellar tendinitis, unspecified knee: Secondary | ICD-10-CM

## 2023-02-24 DIAGNOSIS — M7651 Patellar tendinitis, right knee: Secondary | ICD-10-CM

## 2023-02-24 NOTE — Progress Notes (Signed)
   Post-Op Visit Note   Patient: Devon Hayden           Date of Birth: [birth year]           MRN: [id number] Visit Date: 02/24/2023 PCP: Marguarite Arbour, MD   Assessment & Plan:  Chief Complaint:  Chief Complaint  Patient presents with   Right Knee - Pain   Visit Diagnoses:  1. Patellar tendinosis     Plan: Patient is a pleasant 52 year old gentleman who comes in today 1 week status post right knee patellar tendon repair 02/17/2023.  He has been doing well.  Only taking over-the-counter medicine for pain.  He has been weightbearing as tolerated in a Bledsoe brace.  Examination of the right knee reveals a well-healing surgical scar without complication.  Calf is soft nontender.  He is neurovascularly intact distally.  Today, Steri-Strips applied.  Will have him continue wearing his Bledsoe brace locked from 0 to 30 degrees for another 2 weeks.  He will follow-up in 2 weeks where we will unlock his brace to 60/70 degrees of flexion.  Call with concerns or questions.  Follow-Up Instructions: Return in about 2 weeks (around 03/10/2023).   Orders:  No orders of the defined types were placed in this encounter.  No orders of the defined types were placed in this encounter.   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   TOBACCO ABUSE 02/04/2009   BIPOLAR DISORDER UNSPECIFIED 01/03/2009   Essential hypertension 01/03/2009   SKIN RASH 01/03/2009   Past Medical History:  Diagnosis Date   Bipolar disorder, unspecified (HCC)    Hypertension     Family History  Problem Relation Age of Onset   Diabetes Other        fam hx 1st degree relative   Hypertension Other    Prostate  cancer Other    Cancer Other        prostate 1st degree relative    Past Surgical History:  Procedure Laterality Date   REPAIR OF RUPTURED PATELLA LIGAMENT Right 02/17/2023   Procedure: RIGHT KNEE PATELLAR TENDON REPAIR, LOOSE BODY REMOVAL;  Surgeon: Tarry Kos, MD;  Location: [facility];  Service: Orthopedics;  Laterality: Right;   Social History   Occupational History   Not on file  Tobacco Use   Smoking status: Former   Smokeless tobacco: Never  Substance and Sexual Activity   Alcohol use: Yes   Drug use: Never   Sexual activity: Not on file

## 2023-03-08 ENCOUNTER — Encounter: Payer: Self-pay | Admitting: Orthopaedic Surgery

## 2023-03-16 ENCOUNTER — Ambulatory Visit: Payer: Medicaid Other | Admitting: Orthopaedic Surgery

## 2023-03-16 DIAGNOSIS — M7650 Patellar tendinitis, unspecified knee: Secondary | ICD-10-CM

## 2023-03-16 DIAGNOSIS — M2341 Loose body in knee, right knee: Secondary | ICD-10-CM

## 2023-03-16 NOTE — Progress Notes (Signed)
   Post-Op Visit Note   Patient: Devon Hayden           Date of Birth: 01-25-1971           MRN: [id number] Visit Date: 03/16/2023 PCP: Marguarite Arbour, MD   Assessment & Plan:  Chief Complaint:  Chief Complaint  Patient presents with   Right Knee - Routine Post Op    02/17/23 RT knee patella tendon repair   Visit Diagnoses:  1. Patellar tendinosis   2. Loose body in knee, right knee     Plan: [name] returns today for postop visit status post patella tendon debridement and loose body removal.  He is doing well overall.  His main complaint is that the knee brace does not stay in place.  Exam of the right knee shows healed surgical incision.  He has normal painless range of motion.  No signs of infection.  At this point he can be released to low-level activity that I described and we talked about his activity restrictions for the next couple months.  He can discontinue the brace.  He can follow-up as needed.  Follow-Up Instructions: No follow-ups on file.   Orders:  No orders of the defined types were placed in this encounter.  No orders of the defined types were placed in this encounter.   Imaging: No results found.  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   TOBACCO ABUSE 02/04/2009   BIPOLAR DISORDER UNSPECIFIED 01/03/2009   Essential hypertension 01/03/2009   SKIN RASH 01/03/2009   Past Medical History:  Diagnosis Date   Bipolar disorder, unspecified (HCC)    Hypertension     Family History  Problem Relation Age of Onset   Diabetes Other        fam hx 1st degree relative   Hypertension Other    Prostate cancer Other    Cancer Other        prostate 1st degree  relative    Past Surgical History:  Procedure Laterality Date   REPAIR OF RUPTURED PATELLA LIGAMENT Right 02/17/2023   Procedure: RIGHT KNEE PATELLAR TENDON REPAIR, LOOSE BODY REMOVAL;  Surgeon: Tarry Kos, MD;  Location: [facility];  Service: Orthopedics;  Laterality: Right;   Social History   Occupational History   Not on file  Tobacco Use   Smoking status: Former   Smokeless tobacco: Never  Substance and Sexual Activity   Alcohol use: Yes   Drug use: Never   Sexual activity: Not on file

## 2023-04-08 ENCOUNTER — Other Ambulatory Visit (INDEPENDENT_AMBULATORY_CARE_PROVIDER_SITE_OTHER): Payer: Self-pay

## 2023-04-08 ENCOUNTER — Other Ambulatory Visit (HOSPITAL_COMMUNITY): Payer: Self-pay

## 2023-04-08 ENCOUNTER — Encounter: Payer: Self-pay | Admitting: Orthopaedic Surgery

## 2023-04-08 ENCOUNTER — Ambulatory Visit: Payer: Medicaid Other | Admitting: Orthopaedic Surgery

## 2023-04-08 DIAGNOSIS — M542 Cervicalgia: Secondary | ICD-10-CM

## 2023-04-08 DIAGNOSIS — M47812 Spondylosis without myelopathy or radiculopathy, cervical region: Secondary | ICD-10-CM

## 2023-04-08 MED ORDER — CYCLOBENZAPRINE HCL 5 MG PO TABS
5.0000 mg | ORAL_TABLET | Freq: Every evening | ORAL | 3 refills | Status: AC | PRN
  Filled 2023-04-08: qty 20, 10d supply, fill #0

## 2023-04-08 NOTE — Progress Notes (Signed)
Office Visit Note   Patient: Devon Hayden           Date of Birth: 02-Oct-1970           MRN: [id number] Visit Date: 04/08/2023              Requested by: Tarry Kos, MD [address] [place],  [place] [address] PCP: Marguarite Arbour, MD   Assessment & Plan: Visit Diagnoses:  1. Neck pain   2. Cervical spondylosis     Plan: Impression is 52 year old gentleman with cervical spondylosis.  Will make a referral to outpatient PT for home exercise program.  Prescription for Flexeril at night.  Over-the-counter NSAIDs as needed.  Follow-up as needed.  Follow-Up Instructions: No follow-ups on file.   Orders:  Orders Placed This Encounter  Procedures   XR Cervical Spine 2 or 3 views   Ambulatory referral to Physical Therapy   Meds ordered this encounter  Medications   cyclobenzaprine (FLEXERIL) 5 MG tablet    Sig: Take 1-2 tablets (5-10 mg total) by mouth at bedtime as needed for muscle spasms.    Dispense:  20 tablet    Refill:  3      Procedures: No procedures performed   Clinical Data: No additional findings.   Subjective: Chief Complaint  Patient presents with   Neck - Pain    HPI Devon Hayden comes in today for evaluation of neck pain and stiffness for years.  Has difficulty turning his head.  Worse on the right side.  Denies any radicular symptoms or injuries. Review of Systems  Constitutional: Negative.   HENT: Negative.    Eyes: Negative.   Respiratory: Negative.    Cardiovascular: Negative.   Gastrointestinal: Negative.   Endocrine: Negative.   Genitourinary: Negative.   Skin: Negative.   Allergic/Immunologic: Negative.   Neurological: Negative.   Hematological: Negative.   Psychiatric/Behavioral: Negative.    All other systems reviewed and are negative.    Objective: Vital Signs: There were no vitals taken for this visit.  Physical Exam Vitals and nursing note reviewed.  Constitutional:      Appearance: He is  well-developed.  Pulmonary:     Effort: Pulmonary effort is normal.  Abdominal:     Palpations: Abdomen is soft.  Skin:    General: Skin is warm.  Neurological:     Mental Status: He is alert and oriented to person, place, and time.  Psychiatric:        Behavior: Behavior normal.        Thought Content: Thought content normal.        Judgment: Judgment normal.     Ortho Exam Exam of the cervical spine is nonfocal.  No motor or sensory deficits in the upper extremities. Specialty Comments:  No specialty comments available.  Imaging: XR Cervical Spine 2 or 3 views Result Date: 04/08/2023 X-rays of the cervical spine show preservation of cervical lordosis.  No acute abnormalities.  Diffuse degenerative changes.    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   TOBACCO ABUSE 02/04/2009   BIPOLAR DISORDER UNSPECIFIED 01/03/2009   Essential hypertension 01/03/2009   SKIN RASH 01/03/2009   Past Medical History:  Diagnosis Date  Bipolar disorder, unspecified (HCC)    Hypertension     Family History  Problem Relation Age of Onset   Diabetes Other        fam hx 1st degree relative   Hypertension Other    Prostate cancer Other    Cancer Other        prostate 1st degree relative    Past Surgical History:  Procedure Laterality Date   REPAIR OF RUPTURED PATELLA LIGAMENT Right 02/17/2023   Procedure: RIGHT KNEE PATELLAR TENDON REPAIR, LOOSE BODY REMOVAL;  Surgeon: Tarry Kos, MD;  Location: [facility];  Service: Orthopedics;  Laterality: Right;   Social History   Occupational History   Not on file  Tobacco Use   Smoking status: Former   Smokeless tobacco: Never  Substance and Sexual  Activity   Alcohol use: Yes   Drug use: Never   Sexual activity: Not on file

## 2023-04-28 ENCOUNTER — Ambulatory Visit: Payer: Medicaid Other | Attending: Orthopaedic Surgery

## 2023-04-30 ENCOUNTER — Ambulatory Visit: Payer: Medicaid Other | Admitting: Physician Assistant

## 2023-05-14 DIAGNOSIS — R9431 Abnormal electrocardiogram [ECG] [EKG]: Secondary | ICD-10-CM | POA: Diagnosis not present

## 2023-05-14 DIAGNOSIS — G8929 Other chronic pain: Secondary | ICD-10-CM | POA: Diagnosis not present

## 2023-05-14 DIAGNOSIS — R0602 Shortness of breath: Secondary | ICD-10-CM | POA: Diagnosis not present

## 2023-05-14 DIAGNOSIS — M545 Low back pain, unspecified: Secondary | ICD-10-CM | POA: Diagnosis not present

## 2023-05-14 DIAGNOSIS — Z79899 Other long term (current) drug therapy: Secondary | ICD-10-CM | POA: Diagnosis not present

## 2023-05-14 DIAGNOSIS — M542 Cervicalgia: Secondary | ICD-10-CM | POA: Diagnosis not present

## 2023-05-14 DIAGNOSIS — R5383 Other fatigue: Secondary | ICD-10-CM | POA: Diagnosis not present

## 2023-05-14 DIAGNOSIS — M129 Arthropathy, unspecified: Secondary | ICD-10-CM | POA: Diagnosis not present

## 2023-05-14 DIAGNOSIS — Z6828 Body mass index (BMI) 28.0-28.9, adult: Secondary | ICD-10-CM | POA: Diagnosis not present

## 2023-05-14 LAB — LAB REPORT - SCANNED
EGFR: 50
Free T4: 0.96 ng/dL
TSH: 1.23 (ref 0.41–5.90)

## 2023-05-18 DIAGNOSIS — Z79899 Other long term (current) drug therapy: Secondary | ICD-10-CM | POA: Diagnosis not present

## 2023-05-21 DIAGNOSIS — R7989 Other specified abnormal findings of blood chemistry: Secondary | ICD-10-CM | POA: Diagnosis not present

## 2023-05-21 DIAGNOSIS — N1831 Chronic kidney disease, stage 3a: Secondary | ICD-10-CM | POA: Diagnosis not present

## 2023-05-21 DIAGNOSIS — F330 Major depressive disorder, recurrent, mild: Secondary | ICD-10-CM | POA: Diagnosis not present

## 2023-05-21 DIAGNOSIS — I10 Essential (primary) hypertension: Secondary | ICD-10-CM | POA: Diagnosis not present

## 2023-05-21 DIAGNOSIS — E782 Mixed hyperlipidemia: Secondary | ICD-10-CM | POA: Diagnosis not present

## 2023-05-28 DIAGNOSIS — F4310 Post-traumatic stress disorder, unspecified: Secondary | ICD-10-CM | POA: Diagnosis not present

## 2023-05-28 DIAGNOSIS — F331 Major depressive disorder, recurrent, moderate: Secondary | ICD-10-CM | POA: Diagnosis not present

## 2023-05-28 DIAGNOSIS — F411 Generalized anxiety disorder: Secondary | ICD-10-CM | POA: Diagnosis not present

## 2023-06-01 DIAGNOSIS — Z79899 Other long term (current) drug therapy: Secondary | ICD-10-CM | POA: Diagnosis not present

## 2023-06-01 DIAGNOSIS — Z6828 Body mass index (BMI) 28.0-28.9, adult: Secondary | ICD-10-CM | POA: Diagnosis not present

## 2023-06-01 DIAGNOSIS — R768 Other specified abnormal immunological findings in serum: Secondary | ICD-10-CM | POA: Diagnosis not present

## 2023-06-01 DIAGNOSIS — M47816 Spondylosis without myelopathy or radiculopathy, lumbar region: Secondary | ICD-10-CM | POA: Diagnosis not present

## 2023-06-01 DIAGNOSIS — N1831 Chronic kidney disease, stage 3a: Secondary | ICD-10-CM | POA: Diagnosis not present

## 2023-06-01 DIAGNOSIS — M47812 Spondylosis without myelopathy or radiculopathy, cervical region: Secondary | ICD-10-CM | POA: Diagnosis not present

## 2023-06-10 DIAGNOSIS — R768 Other specified abnormal immunological findings in serum: Secondary | ICD-10-CM | POA: Diagnosis not present

## 2023-06-10 DIAGNOSIS — M47816 Spondylosis without myelopathy or radiculopathy, lumbar region: Secondary | ICD-10-CM | POA: Diagnosis not present

## 2023-06-10 DIAGNOSIS — N1831 Chronic kidney disease, stage 3a: Secondary | ICD-10-CM | POA: Diagnosis not present

## 2023-06-10 DIAGNOSIS — Z6828 Body mass index (BMI) 28.0-28.9, adult: Secondary | ICD-10-CM | POA: Diagnosis not present

## 2023-06-10 DIAGNOSIS — Z79899 Other long term (current) drug therapy: Secondary | ICD-10-CM | POA: Diagnosis not present

## 2023-06-10 DIAGNOSIS — M47812 Spondylosis without myelopathy or radiculopathy, cervical region: Secondary | ICD-10-CM | POA: Diagnosis not present

## 2023-06-14 ENCOUNTER — Ambulatory Visit: Payer: Medicaid Other | Admitting: Physician Assistant

## 2023-06-26 DIAGNOSIS — N1831 Chronic kidney disease, stage 3a: Secondary | ICD-10-CM | POA: Diagnosis not present

## 2023-06-26 DIAGNOSIS — Z79899 Other long term (current) drug therapy: Secondary | ICD-10-CM | POA: Diagnosis not present

## 2023-06-26 DIAGNOSIS — Z6828 Body mass index (BMI) 28.0-28.9, adult: Secondary | ICD-10-CM | POA: Diagnosis not present

## 2023-06-26 DIAGNOSIS — M47816 Spondylosis without myelopathy or radiculopathy, lumbar region: Secondary | ICD-10-CM | POA: Diagnosis not present

## 2023-06-26 DIAGNOSIS — R768 Other specified abnormal immunological findings in serum: Secondary | ICD-10-CM | POA: Diagnosis not present

## 2023-06-26 DIAGNOSIS — M47812 Spondylosis without myelopathy or radiculopathy, cervical region: Secondary | ICD-10-CM | POA: Diagnosis not present

## 2023-07-24 DIAGNOSIS — M47812 Spondylosis without myelopathy or radiculopathy, cervical region: Secondary | ICD-10-CM | POA: Diagnosis not present

## 2023-07-24 DIAGNOSIS — M5030 Other cervical disc degeneration, unspecified cervical region: Secondary | ICD-10-CM | POA: Diagnosis not present

## 2023-08-17 ENCOUNTER — Ambulatory Visit (INDEPENDENT_AMBULATORY_CARE_PROVIDER_SITE_OTHER): Admitting: Orthopaedic Surgery

## 2023-08-17 ENCOUNTER — Encounter: Payer: Self-pay | Admitting: Orthopaedic Surgery

## 2023-08-17 DIAGNOSIS — M542 Cervicalgia: Secondary | ICD-10-CM

## 2023-08-19 NOTE — Progress Notes (Signed)
 Patient left.

## 2023-08-20 ENCOUNTER — Telehealth: Payer: Self-pay

## 2023-08-20 ENCOUNTER — Other Ambulatory Visit: Payer: Self-pay

## 2023-08-20 DIAGNOSIS — M47812 Spondylosis without myelopathy or radiculopathy, cervical region: Secondary | ICD-10-CM

## 2023-08-20 DIAGNOSIS — M542 Cervicalgia: Secondary | ICD-10-CM

## 2023-08-20 NOTE — Telephone Encounter (Signed)
 Patient called Triage line. States he has a scheduled appt with Dr Christiane Cowing on Tuesday 5/13 but would like a Referral to PT in the mean time. States he had this referral in Dec 2024 but was unable to attend at that time.  Made referral to PT-[address] (Patient preference.) and he will see Dr Christiane Cowing 08/24/23.

## 2023-08-24 ENCOUNTER — Encounter: Payer: Self-pay | Admitting: Orthopaedic Surgery

## 2023-08-24 ENCOUNTER — Ambulatory Visit (INDEPENDENT_AMBULATORY_CARE_PROVIDER_SITE_OTHER): Admitting: Orthopaedic Surgery

## 2023-08-24 DIAGNOSIS — M542 Cervicalgia: Secondary | ICD-10-CM

## 2023-08-24 NOTE — Progress Notes (Signed)
 Office Visit Note   Patient: Devon Hayden           Date of Birth: 1970-10-23           MRN: [id number] Visit Date: 08/24/2023              Requested by: Yehuda Helms, MD [address] [facility] [place],  [place] [address] PCP: Yehuda Helms, MD   Assessment & Plan: Visit Diagnoses:  1. Neck pain     Plan: History of Present Illness Devon Hayden is a 53 year old male who presents with worsening neck pain and radiating arm pain.  He experiences persistent neck pain with shooting pain radiating down his arm, which began approximately three weeks after a chiropractor visit. The pain is described as nerve pain occurring throughout the day. A previously prescribed steroid did not provide significant relief. He has not yet attended physical therapy but has an appointment scheduled for tomorrow. He is seeking a location closer to his workplace in [place] to minimize work disruption. The pain impacts his ability to maintain his work schedule.  Exam of cervical spine is unchanged from prior visit.  Results RADIOLOGY Neck X-ray: No abnormal curvature  Assessment and Plan Cervical radiculopathy Cervical radiculopathy with worsening symptoms, likely due to a bulging disc causing nerve compression and inflammation. Previous steroid treatment ineffective. - Order MRI of cervical spine to assess disc bulging and nerve compression due to  - Refer to Dr. Daisey Dryer or NP Elvan Hamel for evaluation of potential cervical epidural steroid injections. - Provide work note for today.  Follow-Up Instructions: No follow-ups on file.   Orders:  Orders Placed This Encounter  Procedures   MR Cervical Spine w/o contrast   No orders of the defined types were placed in this encounter.     Procedures: No procedures performed   Clinical Data: No additional findings.   Subjective: Chief Complaint  Patient presents with   Neck - Pain     HPI  Review of Systems  Constitutional: Negative.   HENT: Negative.    Eyes: Negative.   Respiratory: Negative.    Cardiovascular: Negative.   Gastrointestinal: Negative.   Endocrine: Negative.   Genitourinary: Negative.   Skin: Negative.   Allergic/Immunologic: Negative.   Neurological: Negative.   Hematological: Negative.   Psychiatric/Behavioral: Negative.    All other systems reviewed and are negative.    Objective: Vital Signs: There were no vitals taken for this visit.  Physical Exam Vitals and nursing note reviewed.  Constitutional:      Appearance: He is well-developed.  HENT:     Head: Normocephalic and atraumatic.  Eyes:     Pupils: Pupils are equal, round, and reactive to light.  Pulmonary:     Effort: Pulmonary effort is normal.  Abdominal:     Palpations: Abdomen is soft.  Musculoskeletal:        General: Normal range of motion.     Cervical back: Neck supple.  Skin:    General: Skin is warm.  Neurological:     Mental Status: He is alert and oriented to person, place, and time.  Psychiatric:        Behavior: Behavior normal.        Thought Content: Thought content normal.        Judgment: Judgment normal.    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   Loose  body in knee, right knee 02/17/2023   FOLLICULITIS 02/10/2010   ECZEMA 01/16/2010   TOBACCO USE, QUIT 01/16/2010   LIVER FUNCTION TESTS, ABNORMAL, HX OF 11/20/2009   ONYCHOMYCOSIS, BILATERAL 11/08/2009   TOBACCO ABUSE 02/04/2009   BIPOLAR DISORDER UNSPECIFIED 01/03/2009   Essential hypertension 01/03/2009   SKIN RASH 01/03/2009   Past Medical History:  Diagnosis Date   Bipolar disorder, unspecified (HCC)    Hypertension     Family History  Problem Relation Age of Onset   Diabetes Other        fam hx 1st degree relative   Hypertension Other    Prostate cancer Other    Cancer Other        prostate 1st degree  relative    Past Surgical History:  Procedure Laterality Date   REPAIR OF RUPTURED PATELLA LIGAMENT Right 02/17/2023   Procedure: RIGHT KNEE PATELLAR TENDON REPAIR, LOOSE BODY REMOVAL;  Surgeon: Wes Hamman, MD;  Location: [facility];  Service: Orthopedics;  Laterality: Right;   Social History   Occupational History   Not on file  Tobacco Use   Smoking status: Former   Smokeless tobacco: Never  Substance and Sexual Activity   Alcohol use: Yes   Drug use: Never   Sexual activity: Not on file

## 2023-08-25 ENCOUNTER — Encounter: Payer: Self-pay | Admitting: Orthopaedic Surgery

## 2023-08-25 ENCOUNTER — Ambulatory Visit

## 2023-08-27 DIAGNOSIS — E162 Hypoglycemia, unspecified: Secondary | ICD-10-CM | POA: Diagnosis not present

## 2023-08-27 DIAGNOSIS — R519 Headache, unspecified: Secondary | ICD-10-CM | POA: Diagnosis not present

## 2023-08-27 DIAGNOSIS — Z87891 Personal history of nicotine dependence: Secondary | ICD-10-CM | POA: Diagnosis not present

## 2023-08-27 NOTE — Progress Notes (Signed)
 Office Visit Note  Patient: Devon Hayden             Date of Birth: May 06, 1970           MRN: [id number]             PCP: Yehuda Helms, MD Referring: Yehuda Helms, MD Visit Date: 09/10/2023 Occupation: @GUAROCC @  Subjective:  Abnormal labs   History of Present Illness: Devon Hayden is a 53 y.o. male seen for the evaluation of abnormal labs.  According the patient he has had lower back problems for the last 15 years.  He was diagnosed with degenerative disc disease by Dr. Jackee Marus and was advised surgery.  He has had injections without much help.  He states that [employer] did not cover the surgery.  2 months ago he started having neck pain and stiffness.  He was seen by chiropractor and was found to have loss of lordosis.  2 weeks later he started having right-sided radiculopathy.  He went to see Dr. Christiane Cowing who ordered MRI of the cervical spine and the results are pending.  He continues to have right-sided radiculopathy.  He has been going to pain management for the neck and lower back pain now.  In January 2025 the pain management doctor did labs which came abnormal and he was referred to me for the further evaluation.  He states he has had right knee discomfort last year after riding a bike.  He developed fluid and inflammation in his right knee.  He was seen at the urgent care followed by an orthopedic visit and later an emergency room visit.  He had MRI of his knee joint which showed loose bodies.  He had arthroscopic surgery and did well after that surgery.  None of the other joints are painful.  He denies history of oral ulcers, nasal ulcers, malar rash, photosensitivity, Raynaud's or inflammatory arthritis.  There is no family history of autoimmune disease.  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  He walks about 10,000 steps daily at work.  He is single, retired and has 2 children.  He states his children are in good health.  He drinks beer occasionally over the weekends.  He  is a former smoker and quit smoking about 15 years ago.  He smoked half a pack per day for 7 years while he was in the [employer].    Activities of Daily Living:  Patient reports morning stiffness for 5-10 minutes.   Patient Reports nocturnal pain.  Difficulty dressing/grooming: Denies Difficulty climbing stairs: Denies Difficulty getting out of chair: Denies Difficulty using hands for taps, buttons, cutlery, and/or writing: Denies  Review of Systems  Constitutional:  Negative for fatigue.  HENT:  Negative for mouth sores and mouth dryness.   Eyes:  Negative for dryness.  Respiratory:  Negative for shortness of breath.   Cardiovascular:  Negative for chest pain and palpitations.  Gastrointestinal:  Negative for blood in stool, constipation and diarrhea.  Endocrine: Negative for increased urination.  Genitourinary:  Negative for involuntary urination.  Musculoskeletal:  Positive for morning stiffness. Negative for joint pain, gait problem, joint pain, joint swelling, myalgias, muscle weakness, muscle tenderness and myalgias.  Skin:  Negative for color change, rash, hair loss and sensitivity to sunlight.  Allergic/Immunologic: Negative for susceptible to infections.  Neurological:  Negative for dizziness and headaches.  Hematological:  Negative for swollen glands.  Psychiatric/Behavioral:  Positive for depressed mood. Negative for sleep disturbance. The patient is not nervous/anxious.  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   TOBACCO ABUSE 02/04/2009   BIPOLAR DISORDER UNSPECIFIED 01/03/2009   Essential hypertension 01/03/2009   SKIN RASH 01/03/2009    Past Medical History:  Diagnosis Date   Bipolar  disorder, unspecified (HCC)    Hypertension     Family History  Problem Relation Age of Onset   Diabetes Other        fam hx 1st degree relative   Hypertension Other    Prostate cancer Other    Cancer Other        prostate 1st degree relative   Past Surgical History:  Procedure Laterality Date   REPAIR OF RUPTURED PATELLA LIGAMENT Right 02/17/2023   Procedure: RIGHT KNEE PATELLAR TENDON REPAIR, LOOSE BODY REMOVAL;  Surgeon: Wes Hamman, MD;  Location: [facility];  Service: Orthopedics;  Laterality: Right;   Social History   Social History Narrative   No reg exercise   Immunization History  Administered Date(s) Administered   Influenza Whole 01/16/2010   Moderna Covid-19 Fall Seasonal Vaccine 21yrs & older 07/29/2022   PFIZER Comirnaty(Gray Top)Covid-19 Tri-Sucrose Vaccine 07/19/2020   PFIZER(Purple Top)SARS-COV-2 Vaccination 06/15/2019, 07/04/2019, 01/06/2020   Pfizer(Comirnaty)Fall Seasonal Vaccine 12 years and older 04/25/2023   Td 01/16/2010     Objective: Vital Signs: BP (!) 152/84 (BP Location: Right Arm, Patient Position: Sitting, Cuff Size: Large)   Pulse 64   Resp 14   Ht 6' 0.5" (1.842 m)   Wt 197 lb (89.4 kg)   BMI 26.35 kg/m    Physical Exam Vitals and nursing note reviewed.  Constitutional:      Appearance: He is well-developed.  HENT:     Head: Normocephalic and atraumatic.  Eyes:     Conjunctiva/sclera: Conjunctivae normal.     Pupils: Pupils are equal, round, and reactive to light.  Cardiovascular:     Rate and Rhythm: Normal rate and regular rhythm.     Heart sounds: Normal heart sounds.  Pulmonary:     Effort: Pulmonary effort is normal.     Breath sounds: Normal breath sounds.  Abdominal:     General: Bowel sounds are normal.     Palpations: Abdomen is soft.  Musculoskeletal:     Cervical back: Normal range of motion and neck supple.  Skin:    General: Skin is warm and dry.     Capillary Refill: Capillary refill takes  less than 2 seconds.  Neurological:     Mental Status: He is alert and oriented to person, place, and time.  Psychiatric:        Behavior: Behavior normal.      Musculoskeletal Exam: Patient had limited lateral rotation of the cervical spine with discomfort.  He had no discomfort over thoracic region.  He had tenderness over lumbar region and over SI joints.  He had limited forward flexion of the lumbar spine due to discomfort.  Shoulders, elbows, wrist joints, MCPs PIPs and DIPs were in good range of motion with no synovitis.  He has some limitation of his lateral right third PIP joint due to previous injury.  Hip joints with good range of motion without any discomfort.  Knee joints were in good range of  motion without any warmth swelling or effusion.  There was no tenderness over ankles or MTPs.  He had bilateral bunionectomy scars.  Bilateral pes planus was noted.  CDAI Exam: CDAI Score: -- Patient Global: --; Provider Global: -- Swollen: --; Tender: -- Joint Exam 09/10/2023   No joint exam has been documented for this visit   There is currently no information documented on the homunculus. Go to the Rheumatology activity and complete the homunculus joint exam.  Investigation: No additional findings.  Imaging: No results found.  Recent Labs: Lab Results  Component Value Date   WBC 6.3 12/14/2022   HGB 14.1 12/14/2022   PLT 247 12/14/2022   NA 138 12/14/2022   K 3.9 12/14/2022   CL 105 12/14/2022   CO2 23 12/14/2022   GLUCOSE 92 12/14/2022   BUN 17 12/14/2022   CREATININE 1.64 (H) 12/14/2022   BILITOT 0.4 01/02/2011   ALKPHOS 40 01/02/2011   AST 26 01/02/2011   ALT 33 01/02/2011   PROT 5.4 (L) 01/02/2011   ALBUMIN 2.7 (L) 01/02/2011   CALCIUM 9.0 12/14/2022   GFRAA >60 01/03/2011    Speciality Comments: No specialty comments available.  Procedures:  No procedures performed Allergies: Patient has no known allergies.   Assessment / Plan:     Visit Diagnoses:  Rheumatoid factor positive - 05/14/23:RF 41, ANA+, dsDNA<1, RNP 1.4, Sm-, Ro-, La-, ESR 11, uric acid 6.3, TSH 1.226, patient has positive rheumatoid factor.  He has no clinical features of rheumatoid arthritis.  I advised him to contact me if he develops any joint inflammation or swelling.  Positive ANA (antinuclear antibody) - ANA+, no titer included.  ANA is positive and RNP is positive.  He has no clinical features of mixed connective tissue disease.  He denies history of oral ulcers, nasal ulcers, malar rash, photosensitivity, Raynaud's, lymphadenopathy or inflammatory arthritis.  There is no family history of autoimmune disease.  I advised him to contact me if he develops any new symptoms.  Anti-RNP antibodies present - RNP 1.4 on 05/14/23  Loose body in knee, right knee-patient states he was diagnosed with loose body in his right knee last year.  He had arthroscopic surgery and his symptoms resolved.  He had no warmth swelling or effusion in his knee joints.  Status post bunionectomy-bilateral bunionectomy scars were noted.  He denies any discomfort in his feet.  Pes planus of both feet-use of arch support was advised.  DDD (degenerative disc disease), cervical-he has been having severe pain and discomfort in his cervical spine for the last 2 months.  He states he has been having right-sided radiculopathy.  He had MRI recently and the results are pending.  I reviewed the x-rays of the cervical spine from April 08, 2023 with the patient.  It showed multilevel degenerative disc disease and facet joint arthropathy.  A handout on cervical spine exercises was given.  Spondylosis of lumbar region without myelopathy or radiculopathy-he has been also having lower back pain for the last 15 years.  He has been under care of Dr. Jackee Marus.  He goes to the pain management.  He states he has injections in the past which do not help.  Surgery was advised but the insurance did not cover surgery.  He gets  medications from pain management.  He lives in chronic discomfort.  A handout on back exercises was given.  Essential hypertension-blood pressure was elevated at 152/84.  Patient was advised to monitor blood pressure closely and follow-up with his  PCP.  Other depression  Stage 3a chronic kidney disease (HCC)-followed by his PCP.  Former smoker - 1/2 PPD x 7 years, quit at age 31.  Orders: No orders of the defined types were placed in this encounter.  No orders of the defined types were placed in this encounter.   Face-to-face time spent with patient was 45 minutes. Greater than 50% of time was spent in counseling and coordination of care.  Follow-Up Instructions: Return if symptoms worsen or fail to improve, for Positive ANA, positive RF.   Nicholas Bari, MD  Note - This record has been created using Animal nutritionist.  Chart creation errors have been sought, but may not always  have been located. Such creation errors do not reflect on  the standard of medical care.

## 2023-08-30 DIAGNOSIS — Z833 Family history of diabetes mellitus: Secondary | ICD-10-CM | POA: Diagnosis not present

## 2023-08-30 DIAGNOSIS — E162 Hypoglycemia, unspecified: Secondary | ICD-10-CM | POA: Diagnosis not present

## 2023-08-30 DIAGNOSIS — R7309 Other abnormal glucose: Secondary | ICD-10-CM | POA: Diagnosis not present

## 2023-09-03 ENCOUNTER — Other Ambulatory Visit

## 2023-09-04 ENCOUNTER — Ambulatory Visit
Admission: RE | Admit: 2023-09-04 | Discharge: 2023-09-04 | Disposition: A | Discharge status: Home / Self Care | Source: Ambulatory Visit | Attending: Orthopaedic Surgery | Admitting: Orthopaedic Surgery

## 2023-09-04 DIAGNOSIS — M4802 Spinal stenosis, cervical region: Secondary | ICD-10-CM | POA: Diagnosis not present

## 2023-09-04 DIAGNOSIS — M542 Cervicalgia: Secondary | ICD-10-CM

## 2023-09-04 DIAGNOSIS — M5412 Radiculopathy, cervical region: Secondary | ICD-10-CM | POA: Diagnosis not present

## 2023-09-10 ENCOUNTER — Encounter: Payer: Self-pay | Admitting: Rheumatology

## 2023-09-10 ENCOUNTER — Ambulatory Visit: Payer: Medicaid Other | Attending: Rheumatology | Admitting: Rheumatology

## 2023-09-10 VITALS — BP 163/80 | HR 65 | Resp 14 | Ht 72.5 in | Wt 197.0 lb

## 2023-09-10 DIAGNOSIS — Z87891 Personal history of nicotine dependence: Secondary | ICD-10-CM | POA: Insufficient documentation

## 2023-09-10 DIAGNOSIS — F3289 Other specified depressive episodes: Secondary | ICD-10-CM | POA: Diagnosis not present

## 2023-09-10 DIAGNOSIS — M5030 Other cervical disc degeneration, unspecified cervical region: Secondary | ICD-10-CM | POA: Insufficient documentation

## 2023-09-10 DIAGNOSIS — M67969 Unspecified disorder of synovium and tendon, unspecified lower leg: Secondary | ICD-10-CM

## 2023-09-10 DIAGNOSIS — Z8659 Personal history of other mental and behavioral disorders: Secondary | ICD-10-CM

## 2023-09-10 DIAGNOSIS — M47816 Spondylosis without myelopathy or radiculopathy, lumbar region: Secondary | ICD-10-CM | POA: Insufficient documentation

## 2023-09-10 DIAGNOSIS — Z872 Personal history of diseases of the skin and subcutaneous tissue: Secondary | ICD-10-CM

## 2023-09-10 DIAGNOSIS — Z9889 Other specified postprocedural states: Secondary | ICD-10-CM | POA: Insufficient documentation

## 2023-09-10 DIAGNOSIS — M2341 Loose body in knee, right knee: Secondary | ICD-10-CM | POA: Insufficient documentation

## 2023-09-10 DIAGNOSIS — M2141 Flat foot [pes planus] (acquired), right foot: Secondary | ICD-10-CM | POA: Diagnosis not present

## 2023-09-10 DIAGNOSIS — Z720 Tobacco use: Secondary | ICD-10-CM

## 2023-09-10 DIAGNOSIS — I10 Essential (primary) hypertension: Secondary | ICD-10-CM | POA: Diagnosis not present

## 2023-09-10 DIAGNOSIS — M2142 Flat foot [pes planus] (acquired), left foot: Secondary | ICD-10-CM | POA: Insufficient documentation

## 2023-09-10 DIAGNOSIS — N1831 Chronic kidney disease, stage 3a: Secondary | ICD-10-CM | POA: Insufficient documentation

## 2023-09-10 DIAGNOSIS — R768 Other specified abnormal immunological findings in serum: Secondary | ICD-10-CM | POA: Diagnosis not present

## 2023-09-10 NOTE — Patient Instructions (Signed)
Low Back Sprain or Strain Rehab Ask your health care provider which exercises are safe for you. Do exercises exactly as told by your health care provider and adjust them as directed. It is normal to feel mild stretching, pulling, tightness, or discomfort as you do these exercises. Stop right away if you feel sudden pain or your pain gets worse. Do not begin these exercises until told by your health care provider. Stretching and range-of-motion exercises These exercises warm up your muscles and joints and improve the movement and flexibility of your back. These exercises also help to relieve pain, numbness, and tingling. Lumbar rotation  Lie on your back on a firm bed or the floor with your knees bent. Straighten your arms out to your sides so each arm forms a 90-degree angle (right angle) with a side of your body. Slowly move (rotate) both of your knees to one side of your body until you feel a stretch in your lower back (lumbar). Try not to let your shoulders lift off the floor. Hold this position for __________ seconds. Tense your abdominal muscles and slowly move your knees back to the starting position. Repeat this exercise on the other side of your body. Repeat __________ times. Complete this exercise __________ times a day. Single knee to chest  Lie on your back on a firm bed or the floor with both legs straight. Bend one of your knees. Use your hands to move your knee up toward your chest until you feel a gentle stretch in your lower back and buttock. Hold your leg in this position by holding on to the front of your knee. Keep your other leg as straight as possible. Hold this position for __________ seconds. Slowly return to the starting position. Repeat with your other leg. Repeat __________ times. Complete this exercise __________ times a day. Prone extension on elbows  Lie on your abdomen on a firm bed or the floor (prone position). Prop yourself up on your elbows. Use your arms  to help lift your chest up until you feel a gentle stretch in your abdomen and your lower back. This will place some of your body weight on your elbows. If this is uncomfortable, try stacking pillows under your chest. Your hips should stay down, against the surface that you are lying on. Keep your hip and back muscles relaxed. Hold this position for __________ seconds. Slowly relax your upper body and return to the starting position. Repeat __________ times. Complete this exercise __________ times a day. Strengthening exercises These exercises build strength and endurance in your back. Endurance is the ability to use your muscles for a long time, even after they get tired. Pelvic tilt This exercise strengthens the muscles that lie deep in the abdomen. Lie on your back on a firm bed or the floor with your legs extended. Bend your knees so they are pointing toward the ceiling and your feet are flat on the floor. Tighten your lower abdominal muscles to press your lower back against the floor. This motion will tilt your pelvis so your tailbone points up toward the ceiling instead of pointing to your feet or the floor. To help with this exercise, you may place a small towel under your lower back and try to push your back into the towel. Hold this position for __________ seconds. Let your muscles relax completely before you repeat this exercise. Repeat __________ times. Complete this exercise __________ times a day. Alternating arm and leg raises  Get on your hands  and knees on a firm surface. If you are on a hard floor, you may want to use padding, such as an exercise mat, to cushion your knees. Line up your arms and legs. Your hands should be directly below your shoulders, and your knees should be directly below your hips. Lift your left leg behind you. At the same time, raise your right arm and straighten it in front of you. Do not lift your leg higher than your hip. Do not lift your arm higher  than your shoulder. Keep your abdominal and back muscles tight. Keep your hips facing the ground. Do not arch your back. Keep your balance carefully, and do not hold your breath. Hold this position for __________ seconds. Slowly return to the starting position. Repeat with your right leg and your left arm. Repeat __________ times. Complete this exercise __________ times a day. Abdominal set with straight leg raise  Lie on your back on a firm bed or the floor. Bend one of your knees and keep your other leg straight. Tense your abdominal muscles and lift your straight leg up, 4-6 inches (10-15 cm) off the ground. Keep your abdominal muscles tight and hold this position for __________ seconds. Do not hold your breath. Do not arch your back. Keep it flat against the ground. Keep your abdominal muscles tense as you slowly lower your leg back to the starting position. Repeat with your other leg. Repeat __________ times. Complete this exercise __________ times a day. Single leg lower with bent knees Lie on your back on a firm bed or the floor. Tense your abdominal muscles and lift your feet off the floor, one foot at a time, so your knees and hips are bent in 90-degree angles (right angles). Your knees should be over your hips and your lower legs should be parallel to the floor. Keeping your abdominal muscles tense and your knee bent, slowly lower one of your legs so your toe touches the ground. Lift your leg back up to return to the starting position. Do not hold your breath. Do not let your back arch. Keep your back flat against the ground. Repeat with your other leg. Repeat __________ times. Complete this exercise __________ times a day. Posture and body mechanics Good posture and healthy body mechanics can help to relieve stress in your body's tissues and joints. Body mechanics refers to the movements and positions of your body while you do your daily activities. Posture is part of body  mechanics. Good posture means: Your spine is in its natural S-curve position (neutral). Your shoulders are pulled back slightly. Your head is not tipped forward (neutral). Follow these guidelines to improve your posture and body mechanics in your everyday activities. Standing  When standing, keep your spine neutral and your feet about hip-width apart. Keep a slight bend in your knees. Your ears, shoulders, and hips should line up. When you do a task in which you stand in one place for a long time, place one foot up on a stable object that is 2-4 inches (5-10 cm) high, such as a footstool. This helps keep your spine neutral. Sitting  When sitting, keep your spine neutral and keep your feet flat on the floor. Use a footrest, if necessary, and keep your thighs parallel to the floor. Avoid rounding your shoulders, and avoid tilting your head forward. When working at a desk or a computer, keep your desk at a height where your hands are slightly lower than your elbows. Slide your  chair under your desk so you are close enough to maintain good posture. When working at a computer, place your monitor at a height where you are looking straight ahead and you do not have to tilt your head forward or downward to look at the screen. Resting When lying down and resting, avoid positions that are most painful for you. If you have pain with activities such as sitting, bending, stooping, or squatting, lie in a position in which your body does not bend very much. For example, avoid curling up on your side with your arms and knees near your chest (fetal position). If you have pain with activities such as standing for a long time or reaching with your arms, lie with your spine in a neutral position and bend your knees slightly. Try the following positions: Lying on your side with a pillow between your knees. Lying on your back with a pillow under your knees. Lifting  When lifting objects, keep your feet at least  shoulder-width apart and tighten your abdominal muscles. Bend your knees and hips and keep your spine neutral. It is important to lift using the strength of your legs, not your back. Do not lock your knees straight out. Always ask for help to lift heavy or awkward objects. This information is not intended to replace advice given to you by your health care provider. Make sure you discuss any questions you have with your health care provider. Document Revised: 08/03/2022 Document Reviewed: 06/17/2020 Elsevier Patient Education  2024 [employer]  Cervical Strain and Sprain Rehab Ask your health care provider which exercises are safe for you. Do exercises exactly as told by your health care provider and adjust them as directed. It is normal to feel mild stretching, pulling, tightness, or discomfort as you do these exercises. Stop right away if you feel sudden pain or your pain gets worse. Do not begin these exercises until told by your health care provider. Stretching and range-of-motion exercises Cervical side bending  Using good posture, sit on a stable chair or stand up. Without moving your shoulders, slowly tilt your left / right ear to your shoulder until you feel a stretch in the neck muscles on the opposite side. You should be looking straight ahead. Hold for __________ seconds. Repeat with the other side of your neck. Repeat __________ times. Complete this exercise __________ times a day. Cervical rotation  Using good posture, sit on a stable chair or stand up. Slowly turn your head to the side as if you are looking over your left / right shoulder. Keep your eyes level with the ground. Stop when you feel a stretch along the side and the back of your neck. Hold for __________ seconds. Repeat this by turning to your other side. Repeat __________ times. Complete this exercise __________ times a day. Thoracic extension and pectoral stretch  Roll a towel or a small blanket so it is  about 4 inches (10 cm) in diameter. Lie down on your back on a firm surface. Put the towel in the middle of your back across your spine. It should not be under your shoulder blades. Put your hands behind your head and let your elbows fall out to your sides. Hold for __________ seconds. Repeat __________ times. Complete this exercise __________ times a day. Strengthening exercises Upper cervical flexion  Lie on your back with a thin pillow behind your head or a small, rolled-up towel under your neck. Gently tuck your chin toward your chest and nod  your head down to look toward your feet. Do not lift your head off the pillow. Hold for __________ seconds. Release the tension slowly. Relax your neck muscles completely before you repeat this exercise. Repeat __________ times. Complete this exercise __________ times a day. Cervical extension  Stand about 6 inches (15 cm) away from a wall, with your back facing the wall. Place a soft object, about 6-8 inches (15-20 cm) in diameter, between the back of your head and the wall. A soft object could be a small pillow, a ball, or a folded towel. Gently tilt your head back and press into the soft object. Keep your jaw and forehead relaxed. Hold for __________ seconds. Release the tension slowly. Relax your neck muscles completely before you repeat this exercise. Repeat __________ times. Complete this exercise __________ times a day. Posture and body mechanics Body mechanics refer to the movements and positions of your body while you do your daily activities. Posture is part of body mechanics. Good posture and healthy body mechanics can help to relieve stress in your body's tissues and joints. Good posture means that your spine is in its natural S-curve position (your spine is neutral), your shoulders are pulled back slightly, and your head is not tipped forward. The following are general guidelines for using improved posture and body mechanics in your  everyday activities. Sitting  When sitting, keep your spine neutral and keep your feet flat on the floor. Use a footrest, if needed, and keep your thighs parallel to the floor. Avoid rounding your shoulders. Avoid tilting your head forward. When working at a desk or a computer, keep your desk at a height where your hands are slightly lower than your elbows. Slide your chair under your desk so you are close enough to maintain good posture. When working at a computer, place your monitor at a height where you are looking straight ahead and you do not have to tilt your head forward or downward to look at the screen. Standing  When standing, keep your spine neutral and keep your feet about hip-width apart. Keep a slight bend in your knees. Your ears, shoulders, and hips should line up. When you do a task in which you stand in one place for a long time, place one foot up on a stable object that is 2-4 inches (5-10 cm) high, such as a footstool. This helps keep your spine neutral. Resting When lying down and resting, avoid positions that are most painful for you. Try to support your neck in a neutral position. You can use a contour pillow or a small rolled-up towel. Your pillow should support your neck but not push on it. This information is not intended to replace advice given to you by your health care provider. Make sure you discuss any questions you have with your health care provider. Document Revised: 08/03/2022 Document Reviewed: 10/20/2021 Elsevier Patient Education  2024 [employer].

## 2023-09-21 DIAGNOSIS — F331 Major depressive disorder, recurrent, moderate: Secondary | ICD-10-CM | POA: Diagnosis not present

## 2023-09-27 ENCOUNTER — Other Ambulatory Visit: Payer: Self-pay

## 2023-09-27 ENCOUNTER — Ambulatory Visit: Payer: Self-pay | Admitting: Orthopaedic Surgery

## 2023-09-27 DIAGNOSIS — M542 Cervicalgia: Secondary | ICD-10-CM

## 2023-09-27 NOTE — Progress Notes (Signed)
 Should follow up with Devon Hayden please.  Thanks.

## 2023-09-27 NOTE — Progress Notes (Signed)
 LMOM that Dr.Xu wants patient to see Elvan Hamel for MRI review.

## 2023-09-28 ENCOUNTER — Ambulatory Visit: Admitting: Orthopaedic Surgery

## 2023-09-28 ENCOUNTER — Telehealth: Payer: Self-pay

## 2023-10-01 ENCOUNTER — Encounter: Payer: Self-pay | Admitting: Physical Medicine and Rehabilitation

## 2023-10-01 ENCOUNTER — Other Ambulatory Visit (HOSPITAL_COMMUNITY): Payer: Self-pay

## 2023-10-01 ENCOUNTER — Ambulatory Visit: Admitting: Physical Medicine and Rehabilitation

## 2023-10-01 DIAGNOSIS — R202 Paresthesia of skin: Secondary | ICD-10-CM | POA: Diagnosis not present

## 2023-10-01 DIAGNOSIS — F411 Generalized anxiety disorder: Secondary | ICD-10-CM | POA: Diagnosis not present

## 2023-10-01 DIAGNOSIS — M4802 Spinal stenosis, cervical region: Secondary | ICD-10-CM

## 2023-10-01 DIAGNOSIS — F331 Major depressive disorder, recurrent, moderate: Secondary | ICD-10-CM | POA: Diagnosis not present

## 2023-10-01 DIAGNOSIS — F4310 Post-traumatic stress disorder, unspecified: Secondary | ICD-10-CM | POA: Diagnosis not present

## 2023-10-01 DIAGNOSIS — M5412 Radiculopathy, cervical region: Secondary | ICD-10-CM | POA: Diagnosis not present

## 2023-10-01 MED ORDER — TADALAFIL 10 MG PO TABS
10.0000 mg | ORAL_TABLET | ORAL | 1 refills | Status: AC | PRN
  Filled 2023-10-01: qty 15, 30d supply, fill #0

## 2023-10-01 NOTE — Progress Notes (Unsigned)
 Pain Scale   Average Pain 7 Patient advising he has chronic neck pain. Patient here for MRI review.        +Driver, -BT, -Dye Allergies.

## 2023-10-01 NOTE — Progress Notes (Unsigned)
 Devon Hayden - 53 y.o. male MRN [id number]  Date of birth: Aug 15, 1970  Office Visit Note: Visit Date: 10/01/2023 PCP: Yehuda Helms, MD Referred by: Yehuda Helms, MD  Subjective: Chief Complaint  Patient presents with   Neck - Pain   HPI: Devon Hayden is a 53 y.o. male who comes in today per the request of Dr. Claria Crofts for evaluation of chronic, worsening and severe bilateral neck pain radiating to shoulder and down to forearm. Also reports numbness/tingling to right arm. Pain ongoing for several years, worsened over the last year. His pain worsens with movement and activity. His pain is most severe in the mornings after waking up. He describes pain as sore and aching sensation, currently rates as 8 out of 10. Some relief of pain with home exercise regimen, rest and use of medications. He has attended formal physical therapy in the past for neck issues, no relief of pain with these treatments. He is currently undergoing chronic pain management at [facility], he is prescribed Norco. Recent cervical MRI imaging shows moderate or severe neural foraminal stenosis at the right C4, right greater than left C5, bilateral C6, and left C7 nerve levels. No high grade spinal canal stenosis.          Review of Systems  Musculoskeletal:  Positive for neck pain.  Neurological:  Positive for tingling. Negative for focal weakness and weakness.  All other systems reviewed and are negative.  Otherwise per HPI.  Assessment & Plan: Visit Diagnoses:    ICD-10-CM   1. Radiculopathy, cervical region  M54.12 Ambulatory referral to Physical Medicine Rehab    2. 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 Paresthesia of skin  R20.2 Ambulatory referral to Physical Medicine Rehab       Plan: Findings:  Chronic, worsening and severe bilateral neck pain radiating to shoulder and down to forearm. Patient  continues to have severe pain despite good conservative therapy such as formal physical therapy, home exercise regimen, rest and use of medications.  Patient's clinical presentation and exam are consistent with cervical radiculopathy.  I discussed recent cervical MRI with him today using imaging and spine model.  There is multilevel moderate to severe foraminal stenosis noted.  No high-grade spinal canal stenosis noted.  We discussed treatment plan in detail today.  Next step is to perform diagnostic and hopefully therapeutic right C7-T1 interlaminar epidural steroid injection under fluoroscopic guidance.  He is not currently taking anticoagulant medication.  If good relief of pain with injection we can repeat this procedure infrequently as needed.  I discussed injection procedure in detail today, he has no questions at this time.  His exam today is nonfocal, good strength noted to bilateral upper extremities, no myelopathic symptoms noted.  I did provide patient with isometric neck exercises to perform at home.  Also informed him that Dr. Daisey Dryer will speak with him on the day of injection to answer any questions he may have.  No red flag symptoms noted upon exam today.    Meds & Orders: No orders of the defined types were placed in this encounter.   Orders Placed This Encounter  Procedures   Ambulatory referral to Physical Medicine Rehab    Follow-up: Return for Right C7-T1 interlaminar epidural steroid injection.   Procedures: No procedures performed      Clinical History: CLINICAL DATA:  53 year old male with neck pain, right arm pain and  numbness.   EXAM: MRI CERVICAL SPINE WITHOUT CONTRAST   TECHNIQUE: Multiplanar, multisequence MR imaging of the cervical spine was performed. No intravenous contrast was administered.   COMPARISON:  Cervical spine radiographs 04/08/2023.   FINDINGS: Alignment: Stable mild straightening of cervical lordosis. Subtle degenerative appearing  retrolisthesis of C4 on C5.   Vertebrae: Background bone marrow signal within normal limits. Combined acute and chronic degenerative endplate marrow signal changes especially C4 through C6 with patchy associated marrow edema at those levels (series 4, image 8). No posterior element marrow edema or other acute osseous abnormality.   Cord: Spinal cord signal remains normal. Cord morphology largely normal, see details below.   Posterior Fossa, vertebral arteries, paraspinal tissues: Cervicomedullary junction is within normal limits. Negative visible posterior fossa. Preserved major vascular flow voids in the bilateral neck where the left vertebral artery appears dominant throughout. Negative visible neck soft tissues.   Disc levels:   C2-C3:  Negative.   C3-C4: Mild disc bulging and endplate spurring, mild facet hypertrophy. No significant spinal stenosis. Mostly foraminal involvement with moderate left and moderate to severe right C4 neural foraminal stenosis.   C4-C5: Mild retrolisthesis. Disc space loss. Moderate circumferential disc osteophyte complex with a broad-based posterior component (series 6 image 14). Mild spinal stenosis. Mild if any ventral cord mass effect. Moderate to severe left and severe right C5 foraminal stenosis.   C5-C6: Similar circumferential disc osteophyte complex, broad-based posterior component (series 6, image 18). Mild facet and ligament flavum hypertrophy. No significant spinal stenosis. Moderate to severe bilateral C6 foraminal stenosis.   C6-C7: Circumferential disc osteophyte complex. Broad-based posterior component without spinal stenosis. Moderate to severe left, mild right C7 foraminal stenosis.   C7-T1: Mild to moderate facet hypertrophy on the left. Mild left C8 foraminal stenosis.   No visible upper thoracic spinal stenosis.   IMPRESSION: 1. Multilevel cervical disc and endplate degeneration with acute and chronic degenerative  endplate marrow signal changes; patchy marrow edema C4 through C6 vertebral bodies. 2. Mild associated spinal stenosis at C4-C5. No convincing spinal cord mass effect. No cord signal abnormality. 3. Associated moderate or severe neural foraminal stenosis at the right C4, right greater than left C5, bilateral C6, and left C7 nerve levels.     Electronically Signed   By: Marlise Simpers M.D.   On: 09/27/2023 09:55   He reports that he has quit smoking. He has never been exposed to tobacco smoke. He has never used smokeless tobacco. No results for input(s): HGBA1C, LABURIC in the last 8760 hours.  Objective:  VS:  HT:    WT:   BMI:     BP:   HR: bpm  TEMP: ( )  RESP:  Physical Exam Vitals and nursing note reviewed.  HENT:     Head: Normocephalic and atraumatic.     Right Ear: External ear normal.     Left Ear: External ear normal.     Nose: Nose normal.     Mouth/Throat:     Mouth: Mucous membranes are moist.   Eyes:     Extraocular Movements: Extraocular movements intact.    Cardiovascular:     Rate and Rhythm: Normal rate.     Pulses: Normal pulses.  Pulmonary:     Effort: Pulmonary effort is normal.  Abdominal:     General: Abdomen is flat. There is no distension.   Musculoskeletal:        General: Tenderness present.     Cervical back: Tenderness present.  Comments: Discomfort noted with flexion, extension and side-to-side rotation. Patient has good strength in the upper extremities including 5 out of 5 strength in wrist extension, long finger flexion and APB. Shoulder range of motion is full bilaterally without any sign of impingement. There is no atrophy of the hands intrinsically. Sensation intact bilaterally. Negative Hoffman's sign. Negative Spurling's sign.      Skin:    General: Skin is warm and dry.     Capillary Refill: Capillary refill takes less than 2 seconds.   Neurological:     General: No focal deficit present.     Mental Status: He is alert and  oriented to person, place, and time.   Psychiatric:        Mood and Affect: Mood normal.        Behavior: Behavior normal.     Ortho Exam  Imaging: No results found.  Past Medical/Family/Surgical/Social History: Medications & Allergies reviewed per EMR, new medications updated. 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   TOBACCO ABUSE 02/04/2009   BIPOLAR DISORDER UNSPECIFIED 01/03/2009   Essential hypertension 01/03/2009   SKIN RASH 01/03/2009   Past Medical History:  Diagnosis Date   Bipolar disorder, unspecified (HCC)    Hypertension    Family History  Problem Relation Age of Onset   Diabetes Other        fam hx 1st degree relative   Hypertension Other    Prostate cancer Other    Cancer Other        prostate 1st degree relative   Past Surgical History:  Procedure Laterality Date   REPAIR OF RUPTURED PATELLA LIGAMENT Right 02/17/2023   Procedure: RIGHT KNEE PATELLAR TENDON REPAIR, LOOSE BODY REMOVAL;  Surgeon: Wes Hamman, MD;  Location: [facility];  Service: Orthopedics;  Laterality: Right;   Social History   Occupational History   Not on file  Tobacco Use   Smoking status: Former    Passive exposure: Never   Smokeless tobacco: Never  Vaping Use   Vaping status: Never Used  Substance and Sexual Activity   Alcohol use: Yes   Drug use: Never   Sexual activity: Not on file

## 2023-10-08 DIAGNOSIS — F331 Major depressive disorder, recurrent, moderate: Secondary | ICD-10-CM | POA: Diagnosis not present

## 2023-10-09 DIAGNOSIS — Z79899 Other long term (current) drug therapy: Secondary | ICD-10-CM | POA: Diagnosis not present

## 2023-10-09 DIAGNOSIS — M545 Low back pain, unspecified: Secondary | ICD-10-CM | POA: Diagnosis not present

## 2023-10-09 DIAGNOSIS — M5030 Other cervical disc degeneration, unspecified cervical region: Secondary | ICD-10-CM | POA: Diagnosis not present

## 2023-10-09 DIAGNOSIS — G8929 Other chronic pain: Secondary | ICD-10-CM | POA: Diagnosis not present

## 2023-10-09 DIAGNOSIS — Z6827 Body mass index (BMI) 27.0-27.9, adult: Secondary | ICD-10-CM | POA: Diagnosis not present

## 2023-10-09 DIAGNOSIS — M47812 Spondylosis without myelopathy or radiculopathy, cervical region: Secondary | ICD-10-CM | POA: Diagnosis not present

## 2023-10-11 ENCOUNTER — Encounter: Payer: Self-pay | Admitting: Orthopaedic Surgery

## 2023-10-12 DIAGNOSIS — N1831 Chronic kidney disease, stage 3a: Secondary | ICD-10-CM | POA: Diagnosis not present

## 2023-10-12 DIAGNOSIS — E782 Mixed hyperlipidemia: Secondary | ICD-10-CM | POA: Diagnosis not present

## 2023-10-12 DIAGNOSIS — R7989 Other specified abnormal findings of blood chemistry: Secondary | ICD-10-CM | POA: Diagnosis not present

## 2023-10-12 DIAGNOSIS — I10 Essential (primary) hypertension: Secondary | ICD-10-CM | POA: Diagnosis not present

## 2023-10-12 DIAGNOSIS — Z125 Encounter for screening for malignant neoplasm of prostate: Secondary | ICD-10-CM | POA: Diagnosis not present

## 2023-10-12 DIAGNOSIS — Z0000 Encounter for general adult medical examination without abnormal findings: Secondary | ICD-10-CM | POA: Diagnosis not present

## 2023-10-12 NOTE — Progress Notes (Deleted)
 Office Visit Note  Patient: Devon Hayden             Date of Birth: 03-13-1971           MRN: [id number]             PCP: Auston Reyes BIRCH, MD Referring: Auston Reyes BIRCH, MD Visit Date: 10/19/2023 Occupation: @GUAROCC @  Subjective:  No chief complaint on file.   History of Present Illness: Devon Hayden is a 53 y.o. male ***     Activities of Daily Living:  Patient reports morning stiffness for *** {minute/hour:19697}.   Patient {ACTIONS;DENIES/REPORTS:21021675::Denies} nocturnal pain.  Difficulty dressing/grooming: {ACTIONS;DENIES/REPORTS:21021675::Denies} Difficulty climbing stairs: {ACTIONS;DENIES/REPORTS:21021675::Denies} Difficulty getting out of chair: {ACTIONS;DENIES/REPORTS:21021675::Denies} Difficulty using hands for taps, buttons, cutlery, and/or writing: {ACTIONS;DENIES/REPORTS:21021675::Denies}  No Rheumatology ROS completed.   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   TOBACCO ABUSE 02/04/2009   BIPOLAR DISORDER UNSPECIFIED 01/03/2009   Essential hypertension 01/03/2009   SKIN RASH 01/03/2009    Past Medical History:  Diagnosis Date   Bipolar disorder, unspecified (HCC)    Hypertension     Family History  Problem Relation Age of Onset   Diabetes Other        fam hx 1st degree relative   Hypertension Other    Prostate cancer Other    Cancer Other        prostate 1st degree relative   Past Surgical History:  Procedure Laterality Date   REPAIR OF RUPTURED PATELLA LIGAMENT Right 02/17/2023   Procedure: RIGHT KNEE PATELLAR TENDON REPAIR, LOOSE BODY REMOVAL;  Surgeon: Jerri Kay HERO, MD;  Location: [facility];  Service:  Orthopedics;  Laterality: Right;   Social History   Social History Narrative   No reg exercise   Immunization History  Administered Date(s) Administered   Influenza Whole 01/16/2010   Moderna Covid-19 Fall Seasonal Vaccine 29yrs & older 07/29/2022   PFIZER Comirnaty(Gray Top)Covid-19 Tri-Sucrose Vaccine 07/19/2020   PFIZER(Purple Top)SARS-COV-2 Vaccination 06/15/2019, 07/04/2019, 01/06/2020   Pfizer(Comirnaty)Fall Seasonal Vaccine 12 years and older 04/25/2023   Td 01/16/2010     Objective: Vital Signs: There were no vitals taken for this visit.   Physical Exam   Musculoskeletal Exam: ***  CDAI Exam: CDAI Score: -- Patient Global: --; Provider Global: -- Swollen: --; Tender: -- Joint Exam 10/19/2023   No joint exam has been documented for this visit   There is currently no information documented on the homunculus. Go to the Rheumatology activity and complete the homunculus joint exam.  Investigation: No additional findings.  Imaging: No results found.  Recent Labs: Lab Results  Component Value Date   WBC 6.3 12/14/2022   HGB 14.1 12/14/2022   PLT 247 12/14/2022   NA 138 12/14/2022   K 3.9 12/14/2022   CL 105 12/14/2022   CO2 23 12/14/2022   GLUCOSE 92 12/14/2022   BUN 17 12/14/2022   CREATININE 1.64 (H) 12/14/2022   BILITOT 0.4 01/02/2011   ALKPHOS 40 01/02/2011   AST 26 01/02/2011   ALT 33 01/02/2011   PROT 5.4 (L) 01/02/2011   ALBUMIN 2.7 (L) 01/02/2011   CALCIUM 9.0 12/14/2022   GFRAA >60 01/03/2011    Speciality Comments: No specialty  comments available.  Procedures:  No procedures performed Allergies: Patient has no known allergies.   Assessment / Plan:     Visit Diagnoses: No diagnosis found.  Orders: No orders of the defined types were placed in this encounter.  No orders of the defined types were placed in this encounter.   Face-to-face time spent with patient was *** minutes. Greater than 50% of time was spent in counseling and  coordination of care.  Follow-Up Instructions: No follow-ups on file.   Maya Nash, MD  Note - This record has been created using Animal nutritionist.  Chart creation errors have been sought, but may not always  have been located. Such creation errors do not reflect on  the standard of medical care.

## 2023-10-13 DIAGNOSIS — Z79899 Other long term (current) drug therapy: Secondary | ICD-10-CM | POA: Diagnosis not present

## 2023-10-19 ENCOUNTER — Ambulatory Visit: Payer: Medicaid Other | Admitting: Rheumatology

## 2023-10-25 NOTE — Telephone Encounter (Signed)
 SABRA

## 2023-11-01 DIAGNOSIS — F331 Major depressive disorder, recurrent, moderate: Secondary | ICD-10-CM | POA: Diagnosis not present

## 2023-11-03 ENCOUNTER — Ambulatory Visit: Admitting: Physical Medicine and Rehabilitation

## 2023-11-03 ENCOUNTER — Other Ambulatory Visit: Payer: Self-pay

## 2023-11-03 VITALS — BP 137/88 | HR 83

## 2023-11-03 DIAGNOSIS — M5412 Radiculopathy, cervical region: Secondary | ICD-10-CM

## 2023-11-03 MED ORDER — METHYLPREDNISOLONE ACETATE 40 MG/ML IJ SUSP
40.0000 mg | Freq: Once | INTRAMUSCULAR | Status: AC
  Administered 2023-11-03: 40 mg

## 2023-11-03 NOTE — Patient Instructions (Signed)

## 2023-11-03 NOTE — Progress Notes (Signed)
 Pain Scale   Average Pain 7 Patient advising he has neck pain radiating to his right shoulder area. Patient advised his pain is chronic.        +Driver, -BT, -Dye Allergies.

## 2023-11-07 NOTE — Progress Notes (Signed)
 [name] - 53 y.o. male MRN [id number]  Date of birth: 10-02-1970  Office Visit Note: Visit Date: 11/03/2023 PCP: Auston Reyes BIRCH, MD Referred by: Auston Reyes BIRCH, MD  Subjective: Chief Complaint  Patient presents with   Neck - Pain   HPI:  Devon Hayden is a 53 y.o. male who comes in today at the request of Duwaine Pouch, FNP for planned Right C7-T1 Cervical Interlaminar epidural steroid injection with fluoroscopic guidance.  The patient has failed conservative care including home exercise, medications, time and activity modification.  This injection will be diagnostic and hopefully therapeutic.  Please see requesting physician notes for further details and justification.   ROS Otherwise per HPI.  Assessment & Plan: Visit Diagnoses:    ICD-10-CM   1. Radiculopathy, cervical region  M54.12 XR C-ARM NO REPORT    Epidural Steroid injection    methylPREDNISolone  acetate (DEPO-MEDROL ) injection 40 mg      Plan: No additional findings.   Meds & Orders:  Meds ordered this encounter  Medications   methylPREDNISolone  acetate (DEPO-MEDROL ) injection 40 mg    Orders Placed This Encounter  Procedures   XR C-ARM NO REPORT   Epidural Steroid injection    Follow-up: Return for visit to requesting provider as needed.   Procedures: No procedures performed  Cervical Epidural Steroid Injection - Interlaminar Approach with Fluoroscopic Guidance  Patient: Devon Hayden      Date of Birth: [birth year] MRN: [id number] PCP: Auston Reyes BIRCH, MD      Visit Date: 11/03/2023   Universal Protocol:    Date/Time: 07/27/254:50 PM  Consent Given By: the patient  Position: PRONE  Additional Comments: Vital signs were monitored before and after the procedure. Patient was prepped and draped in the usual sterile fashion. The correct patient, procedure, and site was verified.   Injection Procedure Details:   Procedure diagnoses:  Radiculopathy, cervical region [M54.12]    Meds Administered:  Meds ordered this encounter  Medications   methylPREDNISolone  acetate (DEPO-MEDROL ) injection 40 mg     Laterality: Right  Location/Site: C7-T1  Needle: 3.5 in., 20 ga. Tuohy  Needle Placement: Paramedian epidural space  Findings:  -Comments: Excellent flow of contrast into the epidural space.  Procedure Details: Using a paramedian approach from the side mentioned above, the region overlying the inferior lamina was localized under fluoroscopic visualization and the soft tissues overlying this structure were infiltrated with 4 ml. of 1% Lidocaine  without Epinephrine. A # 20 gauge, Tuohy needle was inserted into the epidural space using a paramedian approach.  The epidural space was localized using loss of resistance along with contralateral oblique bi-planar fluoroscopic views.  After negative aspirate for air, blood, and CSF, a 2 ml. volume of Isovue-250 was injected into the epidural space and the flow of contrast was observed. Radiographs were obtained for documentation purposes.   The injectate was administered into the level noted above.  Additional Comments:  The patient tolerated the procedure well Dressing: 2 x 2 sterile gauze and Band-Aid    Post-procedure details: Patient was observed during the procedure. Post-procedure instructions were reviewed.  Patient left the clinic in stable condition.   Clinical History: CLINICAL DATA:  53 year old male with neck pain, right arm pain and numbness.   EXAM: MRI CERVICAL SPINE WITHOUT CONTRAST   TECHNIQUE: Multiplanar, multisequence MR imaging of the cervical spine was performed. No intravenous contrast was administered.   COMPARISON:  Cervical spine radiographs 04/08/2023.   FINDINGS: Alignment: Stable mild straightening  of cervical lordosis. Subtle degenerative appearing retrolisthesis of C4 on C5.   Vertebrae: Background bone marrow signal within  normal limits. Combined acute and chronic degenerative endplate marrow signal changes especially C4 through C6 with patchy associated marrow edema at those levels (series 4, image 8). No posterior element marrow edema or other acute osseous abnormality.   Cord: Spinal cord signal remains normal. Cord morphology largely normal, see details below.   Posterior Fossa, vertebral arteries, paraspinal tissues: Cervicomedullary junction is within normal limits. Negative visible posterior fossa. Preserved major vascular flow voids in the bilateral neck where the left vertebral artery appears dominant throughout. Negative visible neck soft tissues.   Disc levels:   C2-C3:  Negative.   C3-C4: Mild disc bulging and endplate spurring, mild facet hypertrophy. No significant spinal stenosis. Mostly foraminal involvement with moderate left and moderate to severe right C4 neural foraminal stenosis.   C4-C5: Mild retrolisthesis. Disc space loss. Moderate circumferential disc osteophyte complex with a broad-based posterior component (series 6 image 14). Mild spinal stenosis. Mild if any ventral cord mass effect. Moderate to severe left and severe right C5 foraminal stenosis.   C5-C6: Similar circumferential disc osteophyte complex, broad-based posterior component (series 6, image 18). Mild facet and ligament flavum hypertrophy. No significant spinal stenosis. Moderate to severe bilateral C6 foraminal stenosis.   C6-C7: Circumferential disc osteophyte complex. Broad-based posterior component without spinal stenosis. Moderate to severe left, mild right C7 foraminal stenosis.   C7-T1: Mild to moderate facet hypertrophy on the left. Mild left C8 foraminal stenosis.   No visible upper thoracic spinal stenosis.   IMPRESSION: 1. Multilevel cervical disc and endplate degeneration with acute and chronic degenerative endplate marrow signal changes; patchy marrow edema C4 through C6 vertebral  bodies. 2. Mild associated spinal stenosis at C4-C5. No convincing spinal cord mass effect. No cord signal abnormality. 3. Associated moderate or severe neural foraminal stenosis at the right C4, right greater than left C5, bilateral C6, and left C7 nerve levels.     Electronically Signed   By: VEAR Hurst M.D.   On: 09/27/2023 09:55     Objective:  VS:  HT:    WT:   BMI:     BP:137/88  HR:83bpm  TEMP: ( )  RESP:  Physical Exam Vitals and nursing note reviewed.  Constitutional:      General: He is not in acute distress.    Appearance: Normal appearance. He is not ill-appearing.  HENT:     Head: Normocephalic and atraumatic.     Right Ear: External ear normal.     Left Ear: External ear normal.  Eyes:     Extraocular Movements: Extraocular movements intact.  Cardiovascular:     Rate and Rhythm: Normal rate.     Pulses: Normal pulses.  Abdominal:     General: There is no distension.     Palpations: Abdomen is soft.  Musculoskeletal:        General: No signs of injury.     Cervical back: Neck supple. Tenderness present. No rigidity.     Right lower leg: No edema.     Left lower leg: No edema.     Comments: Patient has good strength in the upper extremities with 5 out of 5 strength in wrist extension long finger flexion APB.  No intrinsic hand muscle atrophy.  Negative Hoffmann's test.  Lymphadenopathy:     Cervical: No cervical adenopathy.  Skin:    Findings: No erythema or rash.  Neurological:  General: No focal deficit present.     Mental Status: He is alert and oriented to person, place, and time.     Sensory: No sensory deficit.     Motor: No weakness or abnormal muscle tone.     Coordination: Coordination normal.  Psychiatric:        Mood and Affect: Mood normal.        Behavior: Behavior normal.      Imaging: No results found.

## 2023-11-07 NOTE — Procedures (Signed)
 Cervical Epidural Steroid Injection - Interlaminar Approach with Fluoroscopic Guidance  Patient: Devon Hayden      Date of Birth: 01/31/1971 MRN: [id number] PCP: Auston Reyes BIRCH, MD      Visit Date: 11/03/2023   Universal Protocol:    Date/Time: 07/27/254:50 PM  Consent Given By: the patient  Position: PRONE  Additional Comments: Vital signs were monitored before and after the procedure. Patient was prepped and draped in the usual sterile fashion. The correct patient, procedure, and site was verified.   Injection Procedure Details:   Procedure diagnoses: Radiculopathy, cervical region [M54.12]    Meds Administered:  Meds ordered this encounter  Medications   methylPREDNISolone  acetate (DEPO-MEDROL ) injection 40 mg     Laterality: Right  Location/Site: C7-T1  Needle: 3.5 in., 20 ga. Tuohy  Needle Placement: Paramedian epidural space  Findings:  -Comments: Excellent flow of contrast into the epidural space.  Procedure Details: Using a paramedian approach from the side mentioned above, the region overlying the inferior lamina was localized under fluoroscopic visualization and the soft tissues overlying this structure were infiltrated with 4 ml. of 1% Lidocaine  without Epinephrine. A # 20 gauge, Tuohy needle was inserted into the epidural space using a paramedian approach.  The epidural space was localized using loss of resistance along with contralateral oblique bi-planar fluoroscopic views.  After negative aspirate for air, blood, and CSF, a 2 ml. volume of Isovue-250 was injected into the epidural space and the flow of contrast was observed. Radiographs were obtained for documentation purposes.   The injectate was administered into the level noted above.  Additional Comments:  The patient tolerated the procedure well Dressing: 2 x 2 sterile gauze and Band-Aid    Post-procedure details: Patient was observed during the procedure. Post-procedure  instructions were reviewed.  Patient left the clinic in stable condition.

## 2023-12-03 ENCOUNTER — Ambulatory Visit (HOSPITAL_COMMUNITY)
Admission: EM | Admit: 2023-12-03 | Discharge: 2023-12-03 | Disposition: A | Discharge status: Home / Self Care | Payer: PRIVATE HEALTH INSURANCE | Attending: Physician Assistant | Admitting: Physician Assistant

## 2023-12-03 ENCOUNTER — Ambulatory Visit (HOSPITAL_COMMUNITY)

## 2023-12-03 ENCOUNTER — Encounter (HOSPITAL_COMMUNITY): Payer: Self-pay

## 2023-12-03 DIAGNOSIS — L03011 Cellulitis of right finger: Secondary | ICD-10-CM

## 2023-12-03 MED ORDER — SULFAMETHOXAZOLE-TRIMETHOPRIM 800-160 MG PO TABS: 1.0000 | ORAL_TABLET | Freq: Two times a day (BID) | ORAL | 0 refills | Status: AC

## 2023-12-03 MED ORDER — PENTAFLUOROPROP-TETRAFLUOROETH EX AERO
INHALATION_SPRAY | CUTANEOUS | Status: AC
  Filled 2023-12-03: qty 30

## 2023-12-03 NOTE — ED Provider Notes (Signed)
 [facility]    CSN: [id number] Arrival date & time: 12/03/23  1601      History   Chief Complaint No chief complaint on file.   HPI Devon Hayden is a 53 y.o. male.   Patient presents with swelling and pain to right index finger around the nailbed.  He reports swelling and redness since last week.  He has been doing warm soaks and applying Neosporin with some improvement.  Denies fever, chills, nausea, vomiting.    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   TOBACCO ABUSE 02/04/2009   BIPOLAR DISORDER UNSPECIFIED 01/03/2009   Essential hypertension 01/03/2009   SKIN RASH 01/03/2009    Past Surgical History:  Procedure Laterality Date   REPAIR OF RUPTURED PATELLA LIGAMENT Right 02/17/2023   Procedure: RIGHT KNEE PATELLAR TENDON REPAIR, LOOSE BODY REMOVAL;  Surgeon: Jerri Kay HERO, MD;  Location: [facility];  Service: Orthopedics;  Laterality: Right;       Home Medications    Prior to Admission medications   Medication Sig Start Date End Date Taking? Authorizing Provider  sulfamethoxazole -trimethoprim  (BACTRIM  DS) 800-160 MG tablet Take 1 tablet by mouth 2 (two) times daily for 5 days. 12/03/23 12/08/23 Yes Ward, Harlene PEDLAR, PA-C  amLODipine -benazepril  (LOTREL) 5-20 MG capsule Take by mouth. 06/22/18 02/17/23  [provider]  buPROPion  (WELLBUTRIN  XL) 300 MG 24 hr tablet Take 300 mg by mouth daily. 09/15/19   [provider]  cyclobenzaprine  (FLEXERIL ) 5 MG tablet Take 1-2 tablets (5-10 mg total) by mouth at bedtime as needed for muscle spasms. Patient not taking:  Reported on 12/03/2023 04/08/23   Jerri Kay HERO, MD  HYDROcodone -acetaminophen  (NORCO) 5-325 MG tablet Take 1 tablet by mouth 3 (three) times daily as needed. 02/12/23   Jule Ronal CROME, PA-C  lisinopril (ZESTRIL) 20 MG tablet Take 20 mg by mouth daily.    [provider]  metoprolol succinate (TOPROL-XL) 25 MG 24 hr tablet Take 25 mg by mouth. 08/10/23   [provider]  tadalafil  (CIALIS ) 10 MG tablet Take 1 tablet by mouth as needed an hour before sex. Do not take more than 1 time in 48 hours. This is not a daily medicine. 09/30/23     tadalafil  (CIALIS ) 5 MG tablet TAKE 1 TABLET (5 MG TOTAL) BY MOUTH ONCE DAILY 05/17/20 06/01/21  Auston Reyes BIRCH, MD  tadalafil  (CIALIS ) 5 MG tablet Take 1 tablet by mouth daily. 07/09/23   [provider]  testosterone  cypionate (DEPO-TESTOSTERONE ) 200 MG/ML injection Inject 200 mg into the muscle every Saturday. 10/18/17   [provider]    Family History Family History  Problem Relation Age of Onset   Diabetes Other        fam hx 1st degree relative   Hypertension Other    Prostate cancer Other    Cancer Other        prostate 1st degree relative    Social History Social History   Tobacco Use   Smoking status: Former    Passive exposure: Never   Smokeless tobacco: Never  Vaping Use   Vaping  status: Never Used  Substance Use Topics   Alcohol use: Yes   Drug use: Never     Allergies   Patient has no known allergies.   Review of Systems Review of Systems  Constitutional:  Negative for chills and fever.  HENT:  Negative for ear pain and sore throat.   Eyes:  Negative for pain and visual disturbance.  Respiratory:  Negative for cough and shortness of breath.   Cardiovascular:  Negative for chest pain and palpitations.  Gastrointestinal:  Negative for abdominal pain and vomiting.  Genitourinary:  Negative for dysuria and hematuria.  Musculoskeletal:  Negative for back pain.  Skin:  Positive for color  change. Negative for rash.  Neurological:  Negative for seizures and syncope.  All other systems reviewed and are negative.    Physical Exam Triage Vital Signs ED Triage Vitals  Encounter Vitals Group     BP 12/03/23 1641 (!) 152/96     Girls Systolic BP Percentile --      Girls Diastolic BP Percentile --      Boys Systolic BP Percentile --      Boys Diastolic BP Percentile --      Pulse Rate 12/03/23 1641 66     Resp 12/03/23 1641 16     Temp 12/03/23 1641 98.2 F (36.8 C)     Temp Source 12/03/23 1641 Oral     SpO2 12/03/23 1641 96 %     Weight --      Height --      Head Circumference --      Peak Flow --      Pain Score 12/03/23 1640 7     Pain Loc --      Pain Education --      Exclude from Growth Chart --    No data found.  Updated Vital Signs BP (!) 152/96 (BP Location: Left Arm)   Pulse 66   Temp 98.2 F (36.8 C) (Oral)   Resp 16   SpO2 96%   Visual Acuity Right Eye Distance:   Left Eye Distance:   Bilateral Distance:    Right Eye Near:   Left Eye Near:    Bilateral Near:     Physical Exam Vitals and nursing note reviewed.  Constitutional:      General: He is not in acute distress.    Appearance: He is well-developed.  HENT:     Head: Normocephalic and atraumatic.  Eyes:     Conjunctiva/sclera: Conjunctivae normal.  Cardiovascular:     Rate and Rhythm: Normal rate and regular rhythm.     Heart sounds: No murmur heard. Pulmonary:     Effort: Pulmonary effort is normal. No respiratory distress.     Breath sounds: Normal breath sounds.  Abdominal:     Palpations: Abdomen is soft.     Tenderness: There is no abdominal tenderness.  Musculoskeletal:        General: No swelling.     Cervical back: Neck supple.  Skin:    General: Skin is warm and dry.     Capillary Refill: Capillary refill takes less than 2 seconds.     Comments: Right index finger with swelling and redness to lateral aspect of nail.  Consistent with paronychia with small  fluctuance noted.  Neurological:     Mental Status: He is alert.  Psychiatric:        Mood and Affect: Mood normal.      UC Treatments / Results  Labs (all labs ordered are listed, but only abnormal results are displayed) Labs Reviewed - No data to display  EKG   Radiology No results found.  Procedures Procedures (including critical care time)  Medications Ordered in UC Medications - No data to display  Initial Impression / Assessment and Plan / UC Course  I have reviewed the triage vital signs and the nursing notes.  Pertinent labs & imaging results that were available during my care of the patient were reviewed by me and considered in my medical decision making (see chart for details).     Paronychia.  Attempted to drain in clinic today with not much discharge.  Advised continued soaks and will start on antibiotic.  Advised return precautions if symptoms become worse. Final Clinical Impressions(s) / UC Diagnoses   Final diagnoses:  Paronychia of finger, right     Discharge Instructions      Can continue with warm soaks  Keep clean, apply antibiotic ointment Take antibiotic as prescribed If symptoms become worse return for evaluation    ED Prescriptions     Medication Sig Dispense Auth. Provider   sulfamethoxazole -trimethoprim  (BACTRIM  DS) 800-160 MG tablet Take 1 tablet by mouth 2 (two) times daily for 5 days. 10 tablet Ward, Elyn Krogh Z, PA-C      PDMP not reviewed this encounter.   Ward, Harlene PEDLAR, PA-C 12/03/23 [phone]

## 2023-12-03 NOTE — Discharge Instructions (Addendum)
 Can continue with warm soaks  Keep clean, apply antibiotic ointment Take antibiotic as prescribed If symptoms become worse return for evaluation

## 2023-12-03 NOTE — ED Triage Notes (Signed)
 Patient reports that he had a hang nail to the right pointer finger and now the finger is infected around the finger nail since last week.  Patient states he has been soaking his right pointer in slt water and applying Neosporin ointment.

## 2023-12-20 ENCOUNTER — Telehealth: Payer: Self-pay | Admitting: Physical Medicine and Rehabilitation

## 2023-12-20 NOTE — Telephone Encounter (Signed)
 Fax to [facility] Medical Board regarding imaging request failed x 2 [phone]. Mailed [address] [place], [place] [address]

## 2023-12-21 ENCOUNTER — Telehealth: Payer: Self-pay | Admitting: *Deleted

## 2023-12-21 DIAGNOSIS — I10 Essential (primary) hypertension: Secondary | ICD-10-CM

## 2023-12-21 NOTE — Progress Notes (Signed)
 Complex Care Management Note Care Guide Note  12/21/2023 Name: Devon Hayden MRN: [id number] DOB: 1970-05-21   Complex Care Management Outreach Attempts: An unsuccessful telephone outreach was attempted today to offer the patient information about available complex care management services.  Follow Up Plan:  Additional outreach attempts will be made to offer the patient complex care management information and services.   Encounter Outcome:  No Answer  [name]  [facility]  [facility], [facility] Guide  Direct Dial: [phone]  Fax [phone]

## 2023-12-23 ENCOUNTER — Encounter: Payer: Self-pay | Admitting: Physical Medicine and Rehabilitation

## 2023-12-24 ENCOUNTER — Other Ambulatory Visit: Payer: Self-pay | Admitting: Physical Medicine and Rehabilitation

## 2023-12-24 DIAGNOSIS — M5412 Radiculopathy, cervical region: Secondary | ICD-10-CM

## 2023-12-24 MED ORDER — PREGABALIN 75 MG PO CAPS: ORAL_CAPSULE | ORAL | 0 refills | Status: AC

## 2023-12-24 NOTE — Progress Notes (Signed)
 Complex Care Management Note Care Guide Note  12/24/2023 Name: Devon Hayden MRN: [id number] DOB: [birth year]   Complex Care Management Outreach Attempts: A second unsuccessful outreach was attempted today to offer the patient with information about available complex care management services.  Follow Up Plan:  Additional outreach attempts will be made to offer the patient complex care management information and services.   Encounter Outcome:  No Answer  [name]  [facility]  [facility], [facility] Guide  Direct Dial: [phone]  Fax [phone]

## 2023-12-30 NOTE — Progress Notes (Signed)
 Complex Care Management Note Care Guide Note  12/30/2023 Name: Chaos Carlile MRN: [id number] DOB: Apr 24, 1970   Complex Care Management Outreach Attempts: A third unsuccessful outreach was attempted today to offer the patient with information about available complex care management services.  Follow Up Plan:  No further outreach attempts will be made at this time. We have been unable to contact the patient to offer or enroll patient in complex care management services.  Encounter Outcome:  No Answer  [name]  [facility]  [facility], [facility] Guide  Direct Dial: [phone]  Fax [phone]

## 2023-12-31 ENCOUNTER — Telehealth: Payer: Self-pay | Admitting: *Deleted

## 2023-12-31 NOTE — Progress Notes (Signed)
 Complex Care Management Note  Care Guide Note 12/31/2023 Name: Devon Hayden MRN: [id number] DOB: 07-06-1970  [name] is a 53 y.o. year old male who sees Sparks, Reyes BIRCH, MD for primary care. I reached out to [name] by phone today to offer complex care management services.  [name] was given information about Complex Care Management services today including:   The Complex Care Management services include support from the care team which includes your Nurse Care Manager, Clinical Social Worker, or Pharmacist.  The Complex Care Management team is here to help remove barriers to the health concerns and goals most important to you. Complex Care Management services are voluntary, and the patient may decline or stop services at any time by request to their care team member.   Complex Care Management Consent Status: During the call, it was determined that the patient's primary care provider is not affiliated with [facility], rendering the patient ineligible for enrollment in Complex Care Management services. Patient was advised to update their Medicaid records with the new provider information however patient said he did not plan to update Medicaid.     Encounter Outcome:  ineligible for enrollment at this time non [facility] patient.  [name]  [facility]  Value-Based Care Institute, [facility] Guide  Direct Dial: [phone]  Fax [phone]

## 2024-01-31 ENCOUNTER — Other Ambulatory Visit: Payer: Self-pay

## 2024-01-31 ENCOUNTER — Ambulatory Visit: Payer: PRIVATE HEALTH INSURANCE | Admitting: Physical Medicine and Rehabilitation

## 2024-01-31 DIAGNOSIS — M5412 Radiculopathy, cervical region: Secondary | ICD-10-CM | POA: Diagnosis not present

## 2024-01-31 MED ORDER — METHYLPREDNISOLONE ACETATE 40 MG/ML IJ SUSP
40.0000 mg | Freq: Once | INTRAMUSCULAR | Status: AC
  Administered 2024-01-31: 40 mg

## 2024-01-31 NOTE — Progress Notes (Signed)
    +  Driver, -BT, -Dye Allergies.

## 2024-01-31 NOTE — Procedures (Signed)
 Cervical Epidural Steroid Injection - Interlaminar Approach with Fluoroscopic Guidance  Patient: Devon Hayden      Date of Birth: Jun 26, 1970 MRN: [id number] PCP: Auston Reyes BIRCH, MD      Visit Date: 01/31/2024   Universal Protocol:    Date/Time: 10/20/251:02 PM  Consent Given By: the patient  Position: PRONE  Additional Comments: Vital signs were monitored before and after the procedure. Patient was prepped and draped in the usual sterile fashion. The correct patient, procedure, and site was verified.   Injection Procedure Details:   Procedure diagnoses: Cervical radiculopathy [M54.12]    Meds Administered:  Meds ordered this encounter  Medications   methylPREDNISolone  acetate (DEPO-MEDROL ) injection 40 mg     Laterality: Right  Location/Site: C7-T1  Needle: 3.5 in., 20 ga. Tuohy  Needle Placement: Paramedian epidural space  Findings:  -Comments: Excellent flow of contrast into the epidural space.  Procedure Details: Using a paramedian approach from the side mentioned above, the region overlying the inferior lamina was localized under fluoroscopic visualization and the soft tissues overlying this structure were infiltrated with 4 ml. of 1% Lidocaine  without Epinephrine. A # 20 gauge, Tuohy needle was inserted into the epidural space using a paramedian approach.  The epidural space was localized using loss of resistance along with contralateral oblique bi-planar fluoroscopic views.  After negative aspirate for air, blood, and CSF, a 2 ml. volume of Isovue-250 was injected into the epidural space and the flow of contrast was observed. Radiographs were obtained for documentation purposes.   The injectate was administered into the level noted above.  Additional Comments:  The patient tolerated the procedure well Dressing: 2 x 2 sterile gauze and Band-Aid    Post-procedure details: Patient was observed during the procedure. Post-procedure instructions  were reviewed.  Patient left the clinic in stable condition.

## 2024-01-31 NOTE — Progress Notes (Signed)
 Devon Hayden - 53 y.o. male MRN [id number]  Date of birth: [birth year]  Office Visit Note: Visit Date: 01/31/2024 PCP: Auston Reyes BIRCH, MD Referred by: Auston Reyes BIRCH, MD  Subjective: Chief Complaint  Patient presents with   Neck - Pain   HPI:  Devon Hayden is a 53 y.o. male who comes in today at the request of Duwaine Pouch, FNP for planned Right C7-T1 Cervical Interlaminar epidural steroid injection with fluoroscopic guidance.  The patient has failed conservative care including home exercise, medications, time and activity modification.  This injection will be diagnostic and hopefully therapeutic.  Please see requesting physician notes for further details and justification.   ROS Otherwise per HPI.  Assessment & Plan: Visit Diagnoses:    ICD-10-CM   1. Cervical radiculopathy  M54.12 XR C-ARM NO REPORT    Epidural Steroid injection    methylPREDNISolone  acetate (DEPO-MEDROL ) injection 40 mg      Plan: No additional findings.   Meds & Orders:  Meds ordered this encounter  Medications   methylPREDNISolone  acetate (DEPO-MEDROL ) injection 40 mg    Orders Placed This Encounter  Procedures   XR C-ARM NO REPORT   Epidural Steroid injection    Follow-up: Return for visit to requesting provider as needed.   Procedures: No procedures performed  Cervical Epidural Steroid Injection - Interlaminar Approach with Fluoroscopic Guidance  Patient: Devon Hayden      Date of Birth: January 09, 1971 MRN: [id number] PCP: Auston Reyes BIRCH, MD      Visit Date: 01/31/2024   Universal Protocol:    Date/Time: 10/20/251:02 PM  Consent Given By: the patient  Position: PRONE  Additional Comments: Vital signs were monitored before and after the procedure. Patient was prepped and draped in the usual sterile fashion. The correct patient, procedure, and site was verified.   Injection Procedure Details:   Procedure diagnoses: Cervical  radiculopathy [M54.12]    Meds Administered:  Meds ordered this encounter  Medications   methylPREDNISolone  acetate (DEPO-MEDROL ) injection 40 mg     Laterality: Right  Location/Site: C7-T1  Needle: 3.5 in., 20 ga. Tuohy  Needle Placement: Paramedian epidural space  Findings:  -Comments: Excellent flow of contrast into the epidural space.  Procedure Details: Using a paramedian approach from the side mentioned above, the region overlying the inferior lamina was localized under fluoroscopic visualization and the soft tissues overlying this structure were infiltrated with 4 ml. of 1% Lidocaine  without Epinephrine. A # 20 gauge, Tuohy needle was inserted into the epidural space using a paramedian approach.  The epidural space was localized using loss of resistance along with contralateral oblique bi-planar fluoroscopic views.  After negative aspirate for air, blood, and CSF, a 2 ml. volume of Isovue-250 was injected into the epidural space and the flow of contrast was observed. Radiographs were obtained for documentation purposes.   The injectate was administered into the level noted above.  Additional Comments:  The patient tolerated the procedure well Dressing: 2 x 2 sterile gauze and Band-Aid    Post-procedure details: Patient was observed during the procedure. Post-procedure instructions were reviewed.  Patient left the clinic in stable condition.   Clinical History: CLINICAL DATA:  53 year old male with neck pain, right arm pain and numbness.   EXAM: MRI CERVICAL SPINE WITHOUT CONTRAST   TECHNIQUE: Multiplanar, multisequence MR imaging of the cervical spine was performed. No intravenous contrast was administered.   COMPARISON:  Cervical spine radiographs 04/08/2023.   FINDINGS: Alignment: Stable mild straightening of cervical  lordosis. Subtle degenerative appearing retrolisthesis of C4 on C5.   Vertebrae: Background bone marrow signal within normal  limits. Combined acute and chronic degenerative endplate marrow signal changes especially C4 through C6 with patchy associated marrow edema at those levels (series 4, image 8). No posterior element marrow edema or other acute osseous abnormality.   Cord: Spinal cord signal remains normal. Cord morphology largely normal, see details below.   Posterior Fossa, vertebral arteries, paraspinal tissues: Cervicomedullary junction is within normal limits. Negative visible posterior fossa. Preserved major vascular flow voids in the bilateral neck where the left vertebral artery appears dominant throughout. Negative visible neck soft tissues.   Disc levels:   C2-C3:  Negative.   C3-C4: Mild disc bulging and endplate spurring, mild facet hypertrophy. No significant spinal stenosis. Mostly foraminal involvement with moderate left and moderate to severe right C4 neural foraminal stenosis.   C4-C5: Mild retrolisthesis. Disc space loss. Moderate circumferential disc osteophyte complex with a broad-based posterior component (series 6 image 14). Mild spinal stenosis. Mild if any ventral cord mass effect. Moderate to severe left and severe right C5 foraminal stenosis.   C5-C6: Similar circumferential disc osteophyte complex, broad-based posterior component (series 6, image 18). Mild facet and ligament flavum hypertrophy. No significant spinal stenosis. Moderate to severe bilateral C6 foraminal stenosis.   C6-C7: Circumferential disc osteophyte complex. Broad-based posterior component without spinal stenosis. Moderate to severe left, mild right C7 foraminal stenosis.   C7-T1: Mild to moderate facet hypertrophy on the left. Mild left C8 foraminal stenosis.   No visible upper thoracic spinal stenosis.   IMPRESSION: 1. Multilevel cervical disc and endplate degeneration with acute and chronic degenerative endplate marrow signal changes; patchy marrow edema C4 through C6 vertebral bodies. 2.  Mild associated spinal stenosis at C4-C5. No convincing spinal cord mass effect. No cord signal abnormality. 3. Associated moderate or severe neural foraminal stenosis at the right C4, right greater than left C5, bilateral C6, and left C7 nerve levels.     Electronically Signed   By: VEAR Hurst M.D.   On: 09/27/2023 09:55     Objective:  VS:  HT:    WT:   BMI:     BP:   HR: bpm  TEMP: ( )  RESP:  Physical Exam Vitals and nursing note reviewed.  Constitutional:      General: He is not in acute distress.    Appearance: Normal appearance. He is not ill-appearing.  HENT:     Head: Normocephalic and atraumatic.     Right Ear: External ear normal.     Left Ear: External ear normal.  Eyes:     Extraocular Movements: Extraocular movements intact.  Cardiovascular:     Rate and Rhythm: Normal rate.     Pulses: Normal pulses.  Abdominal:     General: There is no distension.     Palpations: Abdomen is soft.  Musculoskeletal:        General: No signs of injury.     Cervical back: Neck supple. Tenderness present. No rigidity.     Right lower leg: No edema.     Left lower leg: No edema.     Comments: Patient has good strength in the upper extremities with 5 out of 5 strength in wrist extension long finger flexion APB.  No intrinsic hand muscle atrophy.  Negative Hoffmann's test.  Lymphadenopathy:     Cervical: No cervical adenopathy.  Skin:    Findings: No erythema or rash.  Neurological:  General: No focal deficit present.     Mental Status: He is alert and oriented to person, place, and time.     Sensory: No sensory deficit.     Motor: No weakness or abnormal muscle tone.     Coordination: Coordination normal.  Psychiatric:        Mood and Affect: Mood normal.        Behavior: Behavior normal.      Imaging: XR C-ARM NO REPORT Result Date: 01/31/2024 Please see Notes tab for imaging impression.

## 2024-02-10 ENCOUNTER — Other Ambulatory Visit: Payer: Self-pay | Admitting: Physical Medicine and Rehabilitation

## 2024-02-10 MED ORDER — PREGABALIN 75 MG PO CAPS: 75.0000 mg | ORAL_CAPSULE | Freq: Two times a day (BID) | ORAL | 0 refills | Status: AC

## 2024-02-14 ENCOUNTER — Encounter: Payer: Self-pay | Admitting: Radiology

## 2024-02-15 ENCOUNTER — Other Ambulatory Visit: Payer: Self-pay

## 2024-02-15 ENCOUNTER — Ambulatory Visit: Payer: PRIVATE HEALTH INSURANCE | Admitting: Orthopaedic Surgery

## 2024-02-15 ENCOUNTER — Ambulatory Visit: Payer: Self-pay

## 2024-02-15 ENCOUNTER — Ambulatory Visit: Payer: PRIVATE HEALTH INSURANCE | Admitting: Sports Medicine

## 2024-02-15 ENCOUNTER — Encounter: Payer: Self-pay | Admitting: Sports Medicine

## 2024-02-15 DIAGNOSIS — M25512 Pain in left shoulder: Secondary | ICD-10-CM

## 2024-02-15 MED ORDER — LIDOCAINE HCL 1 % IJ SOLN
2.0000 mL | INTRAMUSCULAR | Status: AC | PRN
  Administered 2024-02-15: 2 mL

## 2024-02-15 MED ORDER — BUPIVACAINE HCL 0.25 % IJ SOLN
2.0000 mL | INTRAMUSCULAR | Status: AC | PRN
  Administered 2024-02-15: 2 mL via INTRA_ARTICULAR

## 2024-02-15 MED ORDER — METHYLPREDNISOLONE ACETATE 40 MG/ML IJ SUSP
80.0000 mg | INTRAMUSCULAR | Status: AC | PRN
  Administered 2024-02-15: 80 mg via INTRA_ARTICULAR

## 2024-02-15 NOTE — Progress Notes (Signed)
   Procedure Note  Patient: Devon Hayden             Date of Birth: October 24, 1970           MRN: [id number]             Visit Date: 02/15/2024  Procedures: Visit Diagnoses:  1. Acute pain of left shoulder    Large Joint Inj: L glenohumeral on 02/15/2024 8:20 AM Indications: pain Details: 22 G 3.5 in needle, ultrasound-guided posterior approach Medications: 2 mL lidocaine  1 %; 2 mL bupivacaine  0.25 %; 80 mg methylPREDNISolone  acetate 40 MG/ML Outcome: tolerated well, no immediate complications  US -guided glenohumeral joint injection, left shoulder After discussion on risks/benefits/indications, informed verbal consent was obtained. A timeout was then performed. The patient was positioned lying lateral recumbent on examination table. The patient's shoulder was prepped with betadine and multiple alcohol swabs and utilizing ultrasound guidance, the patient's glenohumeral joint was identified on ultrasound. Using ultrasound guidance a 22-gauge, 3.5 inch needle with a mixture of 2:2:2 cc's lidocaine :bupivicaine:depomedrol was directed from a lateral to medial direction via in-plane technique into the glenohumeral joint with visualization of appropriate spread of injectate into the joint. Patient tolerated the procedure well without immediate complications.      Procedure, treatment alternatives, risks and benefits explained, specific risks discussed. Consent was given by the patient. Immediately prior to procedure a time out was called to verify the correct patient, procedure, equipment, support staff and site/side marked as required. Patient was prepped and draped in the usual sterile fashion.     - patient tolerated procedure well, discussed post-injection protocol - follow-up with Dr. Jerri for L-shoulder as indicated; I am happy to see them as needed  Lonell Sprang, DO Primary Care Sports Medicine Physician  [facility]  This note was dictated using Dragon  naturally speaking software and may contain errors in syntax, spelling, or content which have not been identified prior to signing this note.

## 2024-02-15 NOTE — Progress Notes (Signed)
 Office Visit Note   Patient: Devon Hayden           Date of Birth: 24-Jul-1970           MRN: [id number] Visit Date: 02/15/2024              Requested by: Auston Reyes BIRCH, MD [address] [facility] [place],  [place] [address] PCP: Auston Reyes BIRCH, MD   Assessment & Plan: Visit Diagnoses:  1. Acute pain of left shoulder     Plan: History of Present Illness Devon Hayden is a 53 year old male who presents with left shoulder pain following a work-related injury.  Approximately one month ago, during a work training session, a larger colleague fell on his left shoulder while practicing defensive techniques. Initially, there was no pain, but discomfort developed over the next two days, especially during shoulder-related activities at the gym.  He has ceased upper body workouts due to persistent pain with any shoulder activity. Conservative measures such as ice and heating pads have not provided relief. Pain is exacerbated by lifting and using his arm and is described as a persistent issue, feeling 'something there'. He continues to exercise his legs and arms as tolerated.  No chest pain, shortness of breath, or palpitations.  Physical Exam MUSCULOSKELETAL: Left shoulder with restricted flexibility. Abduction to 100 degrees with pain on external rotation. Pain on resisted external rotation and abduction.  Assessment and Plan Left shoulder rotator cuff strain Likely small fiber tears in the rotator cuff without complete rupture. - Administered intraarticular cortisone injection by Dr. Burnetta. - Advised rest of the shoulder for four weeks, avoiding shoulder-heavy activities. - Allowed biceps and triceps exercises, but no activities above the shoulder. - Provided rotator cuff strengthening exercises to start after two weeks if symptoms improve.  Total face to face encounter time was greater than 25 minutes and over half of this time was spent  in counseling and/or coordination of care.  Follow-Up Instructions: No follow-ups on file.   Orders:  Orders Placed This Encounter  Procedures   XR Shoulder Left   No orders of the defined types were placed in this encounter.     Procedures: No procedures performed   Clinical Data: No additional findings.   Subjective: Chief Complaint  Patient presents with   Left Shoulder - Pain    HPI  Review of Systems  Constitutional: Negative.   HENT: Negative.    Eyes: Negative.   Respiratory: Negative.    Cardiovascular: Negative.   Gastrointestinal: Negative.   Endocrine: Negative.   Genitourinary: Negative.   Skin: Negative.   Allergic/Immunologic: Negative.   Neurological: Negative.   Hematological: Negative.   Psychiatric/Behavioral: Negative.    All other systems reviewed and are negative.    Objective: Vital Signs: There were no vitals taken for this visit.  Physical Exam Vitals and nursing note reviewed.  Constitutional:      Appearance: He is well-developed.  HENT:     Head: Normocephalic and atraumatic.  Eyes:     Pupils: Pupils are equal, round, and reactive to light.  Pulmonary:     Effort: Pulmonary effort is normal.  Abdominal:     Palpations: Abdomen is soft.  Musculoskeletal:        General: Normal range of motion.     Cervical back: Neck supple.  Skin:    General: Skin is warm.  Neurological:     Mental Status: He is alert and oriented  to person, place, and time.  Psychiatric:        Behavior: Behavior normal.        Thought Content: Thought content normal.        Judgment: Judgment normal.     Ortho Exam  Specialty Comments:  CLINICAL DATA:  53 year old male with neck pain, right arm pain and numbness.   EXAM: MRI CERVICAL SPINE WITHOUT CONTRAST   TECHNIQUE: Multiplanar, multisequence MR imaging of the cervical spine was performed. No intravenous contrast was administered.   COMPARISON:  Cervical spine radiographs  04/08/2023.   FINDINGS: Alignment: Stable mild straightening of cervical lordosis. Subtle degenerative appearing retrolisthesis of C4 on C5.   Vertebrae: Background bone marrow signal within normal limits. Combined acute and chronic degenerative endplate marrow signal changes especially C4 through C6 with patchy associated marrow edema at those levels (series 4, image 8). No posterior element marrow edema or other acute osseous abnormality.   Cord: Spinal cord signal remains normal. Cord morphology largely normal, see details below.   Posterior Fossa, vertebral arteries, paraspinal tissues: Cervicomedullary junction is within normal limits. Negative visible posterior fossa. Preserved major vascular flow voids in the bilateral neck where the left vertebral artery appears dominant throughout. Negative visible neck soft tissues.   Disc levels:   C2-C3:  Negative.   C3-C4: Mild disc bulging and endplate spurring, mild facet hypertrophy. No significant spinal stenosis. Mostly foraminal involvement with moderate left and moderate to severe right C4 neural foraminal stenosis.   C4-C5: Mild retrolisthesis. Disc space loss. Moderate circumferential disc osteophyte complex with a broad-based posterior component (series 6 image 14). Mild spinal stenosis. Mild if any ventral cord mass effect. Moderate to severe left and severe right C5 foraminal stenosis.   C5-C6: Similar circumferential disc osteophyte complex, broad-based posterior component (series 6, image 18). Mild facet and ligament flavum hypertrophy. No significant spinal stenosis. Moderate to severe bilateral C6 foraminal stenosis.   C6-C7: Circumferential disc osteophyte complex. Broad-based posterior component without spinal stenosis. Moderate to severe left, mild right C7 foraminal stenosis.   C7-T1: Mild to moderate facet hypertrophy on the left. Mild left C8 foraminal stenosis.   No visible upper thoracic spinal  stenosis.   IMPRESSION: 1. Multilevel cervical disc and endplate degeneration with acute and chronic degenerative endplate marrow signal changes; patchy marrow edema C4 through C6 vertebral bodies. 2. Mild associated spinal stenosis at C4-C5. No convincing spinal cord mass effect. No cord signal abnormality. 3. Associated moderate or severe neural foraminal stenosis at the right C4, right greater than left C5, bilateral C6, and left C7 nerve levels.     Electronically Signed   By: Devon Hayden M.D.   On: 09/27/2023 09:55  Imaging: XR Shoulder Left Result Date: 02/15/2024 X-rays of the left shoulder show a type II acromion.  Otherwise x-rays are unremarkable.    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   TOBACCO ABUSE 02/04/2009   BIPOLAR DISORDER UNSPECIFIED 01/03/2009   Essential hypertension 01/03/2009   SKIN RASH 01/03/2009   Past Medical History:  Diagnosis Date   Bipolar disorder, unspecified (HCC)    Hypertension     Family History  Problem Relation Age of Onset   Diabetes Other  fam hx 1st degree relative   Hypertension Other    Prostate cancer Other    Cancer Other        prostate 1st degree relative    Past Surgical History:  Procedure Laterality Date   REPAIR OF RUPTURED PATELLA LIGAMENT Right 02/17/2023   Procedure: RIGHT KNEE PATELLAR TENDON REPAIR, LOOSE BODY REMOVAL;  Surgeon: Jerri Kay HERO, MD;  Location: [facility];  Service: Orthopedics;  Laterality: Right;   Social History   Occupational History   Not on file  Tobacco Use   Smoking status: Former    Passive exposure: Never   Smokeless tobacco: Never  Vaping Use   Vaping status: Never Used  Substance  and Sexual Activity   Alcohol use: Yes   Drug use: Never   Sexual activity: Not on file

## 2024-03-28 ENCOUNTER — Encounter: Payer: Self-pay | Admitting: Orthopaedic Surgery

## 2024-04-11 ENCOUNTER — Telehealth: Payer: Self-pay

## 2024-04-11 DIAGNOSIS — M5412 Radiculopathy, cervical region: Secondary | ICD-10-CM

## 2024-04-11 NOTE — Telephone Encounter (Signed)
 Last injection 10/25 %75 relief/function ability Duration of relief/improvement--2 months Current pain score--8 Recent falls or injuries---none Same location and same pain as before
